# Patient Record
Sex: Male | Born: 1975 | Race: White | Hispanic: No | Marital: Married | Smoking: Current every day smoker
Health system: Southern US, Community
[De-identification: ages and names within clinical notes are randomized; demographics above are authoritative.]

## PROBLEM LIST (undated history)

## (undated) DIAGNOSIS — I639 Cerebral infarction, unspecified: Secondary | ICD-10-CM

## (undated) DIAGNOSIS — G4730 Sleep apnea, unspecified: Secondary | ICD-10-CM

## (undated) DIAGNOSIS — R569 Unspecified convulsions: Secondary | ICD-10-CM

---

## 2009-10-13 ENCOUNTER — Ambulatory Visit: Payer: Self-pay | Admitting: Internal Medicine

## 2010-07-19 ENCOUNTER — Ambulatory Visit: Payer: Self-pay | Admitting: Family Medicine

## 2013-12-29 ENCOUNTER — Ambulatory Visit: Payer: Self-pay | Admitting: Physician Assistant

## 2019-04-24 ENCOUNTER — Ambulatory Visit: Payer: Self-pay | Attending: Internal Medicine

## 2019-04-24 DIAGNOSIS — Z23 Encounter for immunization: Secondary | ICD-10-CM

## 2019-04-24 NOTE — Progress Notes (Signed)
   Covid-19 Vaccination Clinic  Name:  Joseph Dunlap    MRN: [id number] DOB: [birth year]  04/24/2019  Mr. [name] was observed post Covid-19 immunization for 15 minutes without incident. He was provided with Vaccine Information Sheet and instruction to access the V-Safe system.   Mr. [name] was instructed to call 911 with any severe reactions post vaccine: Marland Kitchen Difficulty breathing  . Swelling of face and throat  . A fast heartbeat  . A bad rash all over body  . Dizziness and weakness   Immunizations Administered    Name Date Dose VIS Date Route   Moderna COVID-19 Vaccine 04/24/2019 10:22 AM 0.5 mL 01/13/2019 Intramuscular   Manufacturer: Moderna   Lot: [id number]   NDC: 77939-030-09

## 2019-05-26 ENCOUNTER — Ambulatory Visit: Payer: Self-pay | Attending: Internal Medicine

## 2019-05-26 DIAGNOSIS — Z23 Encounter for immunization: Secondary | ICD-10-CM

## 2019-05-26 NOTE — Progress Notes (Signed)
   Covid-19 Vaccination Clinic  Name:  Zannie Runkle    MRN: [id number] DOB: 09-May-1975  05/26/2019  Mr. Monteverde was observed post Covid-19 immunization for 15 minutes without incident. He was provided with Vaccine Information Sheet and instruction to access the V-Safe system.   Mr. Scallon was instructed to call 911 with any severe reactions post vaccine: Marland Kitchen Difficulty breathing  . Swelling of face and throat  . A fast heartbeat  . A bad rash all over body  . Dizziness and weakness   Immunizations Administered    Name Date Dose VIS Date Route   Moderna COVID-19 Vaccine 05/26/2019  8:48 AM 0.5 mL 01/13/2019 Intramuscular   Manufacturer: Moderna   Lot: [id number]   NDC: 50277-412-87

## 2020-04-11 ENCOUNTER — Other Ambulatory Visit: Payer: Self-pay

## 2020-04-11 ENCOUNTER — Emergency Department: Payer: 59

## 2020-04-11 ENCOUNTER — Inpatient Hospital Stay
Admission: EM | Admit: 2020-04-11 | Discharge: 2020-04-13 | DRG: 065 | Disposition: A | Discharge status: Other Acute Inpatient Hospital | Payer: 59 | Attending: Student | Admitting: Student

## 2020-04-11 DIAGNOSIS — E785 Hyperlipidemia, unspecified: Secondary | ICD-10-CM | POA: Diagnosis present

## 2020-04-11 DIAGNOSIS — G4710 Hypersomnia, unspecified: Secondary | ICD-10-CM | POA: Diagnosis present

## 2020-04-11 DIAGNOSIS — I6381 Other cerebral infarction due to occlusion or stenosis of small artery: Secondary | ICD-10-CM | POA: Diagnosis not present

## 2020-04-11 DIAGNOSIS — G9349 Other encephalopathy: Secondary | ICD-10-CM | POA: Diagnosis present

## 2020-04-11 DIAGNOSIS — G4733 Obstructive sleep apnea (adult) (pediatric): Secondary | ICD-10-CM | POA: Diagnosis present

## 2020-04-11 DIAGNOSIS — R400 Somnolence: Secondary | ICD-10-CM | POA: Diagnosis not present

## 2020-04-11 DIAGNOSIS — G9340 Encephalopathy, unspecified: Secondary | ICD-10-CM

## 2020-04-11 DIAGNOSIS — Z20822 Contact with and (suspected) exposure to covid-19: Secondary | ICD-10-CM | POA: Diagnosis present

## 2020-04-11 DIAGNOSIS — F17210 Nicotine dependence, cigarettes, uncomplicated: Secondary | ICD-10-CM | POA: Diagnosis present

## 2020-04-11 DIAGNOSIS — I639 Cerebral infarction, unspecified: Secondary | ICD-10-CM

## 2020-04-11 DIAGNOSIS — R001 Bradycardia, unspecified: Secondary | ICD-10-CM | POA: Diagnosis present

## 2020-04-11 DIAGNOSIS — Z6835 Body mass index (BMI) 35.0-35.9, adult: Secondary | ICD-10-CM

## 2020-04-11 DIAGNOSIS — E669 Obesity, unspecified: Secondary | ICD-10-CM | POA: Diagnosis present

## 2020-04-11 HISTORY — DX: Sleep apnea, unspecified: G47.30

## 2020-04-11 LAB — CBC WITH DIFFERENTIAL/PLATELET
Abs Immature Granulocytes: 0.03 10*3/uL (ref 0.00–0.07)
Basophils Absolute: 0.1 10*3/uL (ref 0.0–0.1)
Basophils Relative: 1 %
Eosinophils Absolute: 0.5 10*3/uL (ref 0.0–0.5)
Eosinophils Relative: 6 %
HCT: 45.1 % (ref 39.0–52.0)
Hemoglobin: 14.8 g/dL (ref 13.0–17.0)
Immature Granulocytes: 0 %
Lymphocytes Relative: 24 %
Lymphs Abs: 2.3 10*3/uL (ref 0.7–4.0)
MCH: 29.4 pg (ref 26.0–34.0)
MCHC: 32.8 g/dL (ref 30.0–36.0)
MCV: 89.5 fL (ref 80.0–100.0)
Monocytes Absolute: 0.8 10*3/uL (ref 0.1–1.0)
Monocytes Relative: 8 %
Neutro Abs: 5.7 10*3/uL (ref 1.7–7.7)
Neutrophils Relative %: 61 %
Platelets: 250 10*3/uL (ref 150–400)
RBC: 5.04 MIL/uL (ref 4.22–5.81)
RDW: 12.8 % (ref 11.5–15.5)
WBC: 9.4 10*3/uL (ref 4.0–10.5)
nRBC: 0 % (ref 0.0–0.2)

## 2020-04-11 LAB — COMPREHENSIVE METABOLIC PANEL
ALT: 34 U/L (ref 0–44)
AST: 22 U/L (ref 15–41)
Albumin: 4 g/dL (ref 3.5–5.0)
Alkaline Phosphatase: 51 U/L (ref 38–126)
Anion gap: 7 (ref 5–15)
BUN: 15 mg/dL (ref 6–20)
CO2: 27 mmol/L (ref 22–32)
Calcium: 8.9 mg/dL (ref 8.9–10.3)
Chloride: 103 mmol/L (ref 98–111)
Creatinine, Ser: 1.01 mg/dL (ref 0.61–1.24)
GFR, Estimated: >60 mL/min (ref >60)
Glucose, Bld: 83 mg/dL (ref 70–99)
Potassium: 4.1 mmol/L (ref 3.5–5.1)
Sodium: 137 mmol/L (ref 135–145)
Total Bilirubin: 1 mg/dL (ref 0.3–1.2)
Total Protein: 7.7 g/dL (ref 6.5–8.1)

## 2020-04-11 LAB — ETHANOL: Alcohol, Ethyl (B): <10 mg/dL (ref <10)

## 2020-04-11 MED ORDER — NALOXONE HCL 2 MG/2ML IJ SOSY
0.4000 mg | PREFILLED_SYRINGE | Freq: Once | INTRAMUSCULAR | Status: AC
  Administered 2020-04-11: 0.4 mg via INTRAVENOUS
  Filled 2020-04-11: qty 2

## 2020-04-11 MED ORDER — SODIUM CHLORIDE 0.9 % IV BOLUS
1000.0000 mL | Freq: Once | INTRAVENOUS | Status: AC
  Administered 2020-04-11: 1000 mL via INTRAVENOUS

## 2020-04-11 NOTE — ED Notes (Signed)
Patient remains lethargic, opens eyes to verbal stimuli, urinal placed at bedside for urine specimen.

## 2020-04-11 NOTE — ED Provider Notes (Signed)
[facility] Emergency Department Provider Note  ____________________________________________   Event Date/Time   First MD Initiated Contact with Patient 04/11/20 2100     (approximate)  I have reviewed the triage vital signs and the nursing notes.   HISTORY  Chief Complaint Altered Mental Status    HPI Joseph Dunlap is a 45 y.o. male who comes in with difficulty to arouse. On EMS arrival patient was noted to be arousable but more lethargic than normal.  They would say his name and he would make some movements but would not really talk much.  Patient's blood pressure was 104/50, normal glucose.  Patient got 500 mL normal saline.  He was given atropine x2 at 0.5 due to low heart rates.  Patient is able to answer my questions but he is whispering. I asked if he used any drugs and he replied "heroin". I asked how when he used it and he said " I don't remember"  I asked if his wife new about using it and he said "yes".  When I asked if he snorted or injected he said "into nerves"  Unable to tell me how much.  Unknown LLK.   History limited due to patient's altered mental status     History reviewed. No pertinent past medical history.  There are no problems to display for this patient.   History reviewed. No pertinent surgical history.  Prior to Admission medications   Not on File    Allergies Patient has no allergy information on record.  No family history on file.  Social History   Endorses heroin use.  Denies alcohol.   Review of Systems Review of systems is limited due to patient's altered mental status. ____________________________________________   PHYSICAL EXAM:  VITAL SIGNS: ED Triage Vitals  Enc Vitals Group     BP 04/11/20 2111 (!) 141/86     Pulse Rate 04/11/20 2111 63     Resp 04/11/20 2111 15     Temp 04/11/20 2111 97.9 F (36.6 C)     Temp Source 04/11/20 2111 Oral     SpO2 04/11/20 2111 98 %     Weight 04/11/20  2113 280 lb (127 kg)     Height 04/11/20 2113 6\' 3"  (1.905 m)     Head Circumference --      Peak Flow --      Pain Score --      Pain Loc --      Pain Edu? --      Excl. in GC? --     Constitutional: middle age male, sleepy but patient appears tired and is whispering to me. Eyes: Conjunctivae are normal. EOMI. pupils are equal size. Head: Atraumatic. Nose: No congestion/rhinnorhea. Mouth/Throat: Mucous membranes are moist.   Neck: No stridor. Trachea Midline. FROM Cardiovascular: Normal rate, regular rhythm. Grossly normal heart sounds.  Good peripheral circulation. Respiratory: Normal respiratory effort.  No retractions. Lungs CTAB. Gastrointestinal: Soft and nontender. No distention. No abdominal bruits.  Musculoskeletal: No lower extremity tenderness nor edema.  No joint effusions. Neurologic:  Patient able to stand up and sit down on the stretcher. No obvious weakness in arms or legs but limited examination due to pt not consistently following commands. No facial droop. Pt whispering but not aphasia.  Skin:  Skin is warm, dry and intact. No rash noted. Psychiatric: unable to fully access  GU: Deferred   ____________________________________________   LABS (all labs ordered are listed, but only abnormal results are  displayed)  Labs Reviewed  CBC WITH DIFFERENTIAL/PLATELET  COMPREHENSIVE METABOLIC PANEL  ETHANOL  URINE DRUG SCREEN, QUALITATIVE ([facility] ONLY)  URINALYSIS, COMPLETE ([facility]) WITH MICROSCOPIC  ACETAMINOPHEN LEVEL  SALICYLATE LEVEL   ____________________________________________   ED ECG REPORT I, Concha Se, the attending physician, personally viewed and interpreted this ECG.  Normal sinus rate of 63, no ST elevation, no T wave inversions, normal intervals ____________________________________________  RADIOLOGY Vela Prose, personally viewed and evaluated these images (plain radiographs) as part of my medical decision making, as well as reviewing the  written report by the radiologist.  ED MD interpretation:  pending   ____________________________________________   PROCEDURES  Procedure(s) performed (including Critical Care):  .1-3 Lead EKG Interpretation Performed by: Concha Se, MD Authorized by: Concha Se, MD     Interpretation: abnormal     ECG rate:  50-60s   ECG rate assessment: bradycardic     Rhythm: sinus rhythm     Ectopy: none     Conduction: normal       ____________________________________________   INITIAL IMPRESSION / ASSESSMENT AND PLAN / ED COURSE  [name] was evaluated in Emergency Department on 04/11/2020 for the symptoms described in the history of present illness. He was evaluated in the context of the global COVID-19 pandemic, which necessitated consideration that the patient might be at risk for infection with the SARS-CoV-2 virus that causes COVID-19. Institutional protocols and algorithms that pertain to the evaluation of patients at risk for COVID-19 are in a state of rapid change based on information released by regulatory bodies including the CDC and federal and state organizations. These policies and algorithms were followed during the patient's care in the ED.    Patient is a 45 year old who was difficult to arouse by family and brought to the ER.  Concern for bradycardia but suspect that his heart rate was just low secondary to him sleeping and this questionable heroin use although no pin point pupils.  Unknown LLK but no focal abnormality of neuro exam. Able to stand up and get into stretcher.  We will keep patient on the cardiac monitor.  Will get labs evaluate for Electra abnormalities, AKI.  No signs of trauma to suggest intracranial hemorrhage.  At this time patient's not hypoxic and has normal respiratory rate so do not think we need to give Narcan  Pt still somnolent trialed narcan with no response.  Called wife and she denies him ever using heroin?  Pt did say he used  heroin when I asked about drug use.  No signs of trauma per wife. Reports history of sleep apnea and difficulty waking sometimes but never like this. Will add on CT head  Pt handed off to incoming team pending CT scan/further workup and evluate for AMS.          ____________________________________________   FINAL CLINICAL IMPRESSION(S) / ED DIAGNOSES   Final diagnoses:  Somnolence      MEDICATIONS GIVEN DURING THIS VISIT:  Medications  sodium chloride 0.9 % bolus 1,000 mL (has no administration in time range)  naloxone [facility]) injection 0.4 mg (0.4 mg Intravenous Given 04/11/20 2253)  sodium chloride 0.9 % bolus 1,000 mL (0 mLs Intravenous Stopped 04/12/20 0033)     ED Discharge Orders    None       Note:  This document was prepared using Dragon voice recognition software and may include unintentional dictation errors.   Concha Se, MD 04/12/20 [phone]

## 2020-04-11 NOTE — ED Triage Notes (Signed)
Pt arrives to ED from home via [facility] with c/c of difficult to rouse and altered mental status. Pts wife stated to EMS that he is often difficult to rouse but this was way more difficult than usual. EMS reports initial vitals of p 40, 104/50, CBG 82. Pt given bolus 500 mL NS and 0.5 mg atropine x2 with subsequent pulse of 75. EMS placed 18G in right AC. Upon arrival, pt somnolent but roused to voice. With repeated questioning pt whispered that he had used heroin. Pt unable or unwilling to respond further.

## 2020-04-11 NOTE — ED Notes (Addendum)
Resumed care from robin rn.  Pt sleeping.  Sinus brady on monitor.  Iv fluids infusing.  No response to narcan.

## 2020-04-11 NOTE — ED Notes (Signed)
[phone] wifes number for ride home

## 2020-04-12 ENCOUNTER — Inpatient Hospital Stay: Payer: 59

## 2020-04-12 ENCOUNTER — Other Ambulatory Visit: Payer: Self-pay

## 2020-04-12 ENCOUNTER — Emergency Department: Payer: 59

## 2020-04-12 ENCOUNTER — Inpatient Hospital Stay: Admit: 2020-04-12 | Payer: 59

## 2020-04-12 ENCOUNTER — Encounter: Payer: Self-pay | Admitting: Family Medicine

## 2020-04-12 DIAGNOSIS — Z9989 Dependence on other enabling machines and devices: Secondary | ICD-10-CM | POA: Diagnosis not present

## 2020-04-12 DIAGNOSIS — Z6835 Body mass index (BMI) 35.0-35.9, adult: Secondary | ICD-10-CM | POA: Diagnosis not present

## 2020-04-12 DIAGNOSIS — G9349 Other encephalopathy: Secondary | ICD-10-CM | POA: Diagnosis present

## 2020-04-12 DIAGNOSIS — R400 Somnolence: Secondary | ICD-10-CM | POA: Diagnosis present

## 2020-04-12 DIAGNOSIS — G4710 Hypersomnia, unspecified: Secondary | ICD-10-CM | POA: Diagnosis present

## 2020-04-12 DIAGNOSIS — I63139 Cerebral infarction due to embolism of unspecified carotid artery: Secondary | ICD-10-CM

## 2020-04-12 DIAGNOSIS — E785 Hyperlipidemia, unspecified: Secondary | ICD-10-CM

## 2020-04-12 DIAGNOSIS — I6381 Other cerebral infarction due to occlusion or stenosis of small artery: Secondary | ICD-10-CM | POA: Diagnosis present

## 2020-04-12 DIAGNOSIS — G9340 Encephalopathy, unspecified: Secondary | ICD-10-CM | POA: Diagnosis not present

## 2020-04-12 DIAGNOSIS — I639 Cerebral infarction, unspecified: Secondary | ICD-10-CM | POA: Diagnosis present

## 2020-04-12 DIAGNOSIS — R001 Bradycardia, unspecified: Secondary | ICD-10-CM | POA: Diagnosis present

## 2020-04-12 DIAGNOSIS — Z20822 Contact with and (suspected) exposure to covid-19: Secondary | ICD-10-CM | POA: Diagnosis present

## 2020-04-12 DIAGNOSIS — F17210 Nicotine dependence, cigarettes, uncomplicated: Secondary | ICD-10-CM | POA: Diagnosis present

## 2020-04-12 DIAGNOSIS — G4733 Obstructive sleep apnea (adult) (pediatric): Secondary | ICD-10-CM | POA: Diagnosis present

## 2020-04-12 DIAGNOSIS — E669 Obesity, unspecified: Secondary | ICD-10-CM | POA: Diagnosis present

## 2020-04-12 LAB — URINALYSIS, COMPLETE (UACMP) WITH MICROSCOPIC
Bacteria, UA: NONE SEEN
Bilirubin Urine: NEGATIVE
Glucose, UA: NEGATIVE mg/dL
Hgb urine dipstick: NEGATIVE
Ketones, ur: NEGATIVE mg/dL
Leukocytes,Ua: NEGATIVE
Nitrite: NEGATIVE
Protein, ur: NEGATIVE mg/dL
Specific Gravity, Urine: 1.021 (ref 1.005–1.030)
pH: 5 (ref 5.0–8.0)

## 2020-04-12 LAB — URINE DRUG SCREEN, QUALITATIVE (ARMC ONLY)
Amphetamines, Ur Screen: NOT DETECTED
Barbiturates, Ur Screen: NOT DETECTED
Benzodiazepine, Ur Scrn: NOT DETECTED
Cannabinoid 50 Ng, Ur ~~LOC~~: NOT DETECTED
Cocaine Metabolite,Ur ~~LOC~~: NOT DETECTED
MDMA (Ecstasy)Ur Screen: NOT DETECTED
Methadone Scn, Ur: NOT DETECTED
Opiate, Ur Screen: NOT DETECTED
Phencyclidine (PCP) Ur S: NOT DETECTED
Tricyclic, Ur Screen: NOT DETECTED

## 2020-04-12 LAB — SEDIMENTATION RATE: Sed Rate: 19 mm/hr — ABNORMAL HIGH (ref 0–15)

## 2020-04-12 LAB — MAGNESIUM: Magnesium: 1.9 mg/dL (ref 1.7–2.4)

## 2020-04-12 LAB — BASIC METABOLIC PANEL
Anion gap: 6 (ref 5–15)
BUN: 15 mg/dL (ref 6–20)
CO2: 27 mmol/L (ref 22–32)
Calcium: 9 mg/dL (ref 8.9–10.3)
Chloride: 102 mmol/L (ref 98–111)
Creatinine, Ser: 1.13 mg/dL (ref 0.61–1.24)
GFR, Estimated: >60 mL/min (ref >60)
Glucose, Bld: 116 mg/dL — ABNORMAL HIGH (ref 70–99)
Potassium: 4.1 mmol/L (ref 3.5–5.1)
Sodium: 135 mmol/L (ref 135–145)

## 2020-04-12 LAB — GLUCOSE, CAPILLARY
Glucose-Capillary: 100 mg/dL — ABNORMAL HIGH (ref 70–99)
Glucose-Capillary: 125 mg/dL — ABNORMAL HIGH (ref 70–99)
Glucose-Capillary: 61 mg/dL — ABNORMAL LOW (ref 70–99)

## 2020-04-12 LAB — RESP PANEL BY RT-PCR (FLU A&B, COVID) ARPGX2
Influenza A by PCR: NEGATIVE
Influenza B by PCR: NEGATIVE
SARS Coronavirus 2 by RT PCR: NEGATIVE

## 2020-04-12 LAB — ANTITHROMBIN III: AntiThromb III Func: 92 % (ref 75–120)

## 2020-04-12 LAB — SALICYLATE LEVEL: Salicylate Lvl: <7 mg/dL — ABNORMAL LOW (ref 7.0–30.0)

## 2020-04-12 LAB — VITAMIN B12: Vitamin B-12: 289 pg/mL (ref 180–914)

## 2020-04-12 LAB — FOLATE: Folate: 11.4 ng/mL (ref >5.9)

## 2020-04-12 LAB — ACETAMINOPHEN LEVEL: Acetaminophen (Tylenol), Serum: <10 ug/mL — ABNORMAL LOW (ref 10–30)

## 2020-04-12 LAB — HIV ANTIBODY (ROUTINE TESTING W REFLEX): HIV Screen 4th Generation wRfx: NONREACTIVE

## 2020-04-12 LAB — AMMONIA: Ammonia: 14 umol/L (ref 9–35)

## 2020-04-12 LAB — TSH: TSH: 1.655 u[IU]/mL (ref 0.350–4.500)

## 2020-04-12 MED ORDER — ENOXAPARIN SODIUM 80 MG/0.8ML ~~LOC~~ SOLN
65.0000 mg | SUBCUTANEOUS | Status: DC
  Administered 2020-04-12 – 2020-04-13 (×2): 65 mg via SUBCUTANEOUS
  Filled 2020-04-12 (×2): qty 0.8

## 2020-04-12 MED ORDER — ASPIRIN 325 MG PO TABS
325.0000 mg | ORAL_TABLET | Freq: Every day | ORAL | Status: DC
  Filled 2020-04-12: qty 1

## 2020-04-12 MED ORDER — FAMOTIDINE 20 MG PO TABS
20.0000 mg | ORAL_TABLET | Freq: Every day | ORAL | Status: DC
  Administered 2020-04-12 – 2020-04-13 (×2): 20 mg via ORAL
  Filled 2020-04-12 (×2): qty 1

## 2020-04-12 MED ORDER — SODIUM CHLORIDE 0.9 % IV BOLUS (SEPSIS)
1000.0000 mL | Freq: Once | INTRAVENOUS | Status: AC
  Administered 2020-04-12: 1000 mL via INTRAVENOUS

## 2020-04-12 MED ORDER — MAGNESIUM HYDROXIDE 400 MG/5ML PO SUSP
30.0000 mL | Freq: Every day | ORAL | Status: DC | PRN
  Filled 2020-04-12: qty 30

## 2020-04-12 MED ORDER — DEXTROSE IN LACTATED RINGERS 5 % IV SOLN: INTRAVENOUS | Status: DC

## 2020-04-12 MED ORDER — ACETAMINOPHEN 325 MG PO TABS: 650.0000 mg | ORAL_TABLET | Freq: Four times a day (QID) | ORAL | Status: DC | PRN

## 2020-04-12 MED ORDER — TRAZODONE HCL 50 MG PO TABS: 25.0000 mg | ORAL_TABLET | Freq: Every evening | ORAL | Status: DC | PRN

## 2020-04-12 MED ORDER — CLOPIDOGREL BISULFATE 75 MG PO TABS
75.0000 mg | ORAL_TABLET | Freq: Every day | ORAL | Status: DC
  Administered 2020-04-13: 10:00:00 75 mg via ORAL
  Filled 2020-04-12: qty 1

## 2020-04-12 MED ORDER — ACETAMINOPHEN 650 MG RE SUPP: 650.0000 mg | Freq: Four times a day (QID) | RECTAL | Status: DC | PRN

## 2020-04-12 MED ORDER — THIAMINE HCL 100 MG/ML IJ SOLN: 250.0000 mg | Freq: Every day | INTRAVENOUS | Status: DC

## 2020-04-12 MED ORDER — ONDANSETRON HCL 4 MG PO TABS: 4.0000 mg | ORAL_TABLET | Freq: Four times a day (QID) | ORAL | Status: DC | PRN

## 2020-04-12 MED ORDER — THIAMINE HCL 100 MG/ML IJ SOLN: 100.0000 mg | Freq: Every day | INTRAMUSCULAR | Status: DC

## 2020-04-12 MED ORDER — THIAMINE HCL 100 MG/ML IJ SOLN
500.0000 mg | Freq: Three times a day (TID) | INTRAVENOUS | Status: DC
  Administered 2020-04-12 – 2020-04-13 (×3): 500 mg via INTRAVENOUS
  Filled 2020-04-12 (×6): qty 5

## 2020-04-12 MED ORDER — ASPIRIN 300 MG RE SUPP: 300.0000 mg | Freq: Every day | RECTAL | Status: DC

## 2020-04-12 MED ORDER — CLOPIDOGREL BISULFATE 75 MG PO TABS
300.0000 mg | ORAL_TABLET | Freq: Once | ORAL | Status: AC
  Administered 2020-04-12: 22:00:00 300 mg via ORAL
  Filled 2020-04-12: qty 4

## 2020-04-12 MED ORDER — ONDANSETRON HCL 4 MG/2ML IJ SOLN: 4.0000 mg | Freq: Four times a day (QID) | INTRAMUSCULAR | Status: DC | PRN

## 2020-04-12 MED ORDER — IOHEXOL 350 MG/ML SOLN
75.0000 mL | Freq: Once | INTRAVENOUS | Status: AC | PRN
  Administered 2020-04-12: 75 mL via INTRAVENOUS

## 2020-04-12 MED ORDER — ATORVASTATIN CALCIUM 20 MG PO TABS
40.0000 mg | ORAL_TABLET | Freq: Every day | ORAL | Status: DC
  Filled 2020-04-12: qty 2

## 2020-04-12 MED ORDER — STROKE: EARLY STAGES OF RECOVERY BOOK: Freq: Once | Status: AC

## 2020-04-12 MED ORDER — ASPIRIN EC 81 MG PO TBEC
81.0000 mg | DELAYED_RELEASE_TABLET | Freq: Every day | ORAL | Status: DC
  Administered 2020-04-12 – 2020-04-13 (×2): 81 mg via ORAL
  Filled 2020-04-12 (×2): qty 1

## 2020-04-12 MED ORDER — VITAMIN B-12 1000 MCG PO TABS
1000.0000 ug | ORAL_TABLET | Freq: Every day | ORAL | Status: DC
  Administered 2020-04-13: 1000 ug via ORAL
  Filled 2020-04-12 (×3): qty 1

## 2020-04-12 MED ORDER — SODIUM CHLORIDE 0.9 % IV SOLN: INTRAVENOUS | Status: DC

## 2020-04-12 NOTE — Consult Note (Signed)
NEUROLOGY CONSULTATION NOTE   Date of service: April 12, 2020 Patient Name: Joseph Dunlap MRN:  [id number] DOB:  [birth year] Reason for consult: "Stroke on MRI" _ _ _   _ __   _ __ _ _  __ __   _ __   __ _  History of Present Illness  Joseph Dunlap is a 45 y.o. male with PMH significant for  OSA who presents with confusion and difficulty waking up.   Patient unable to provide any meaningful history. Discussed cat and dog and seeing a devil when asked questions related to his presentation to the ED.  Joseph Dunlap reports that it is not unusual for patient to have difficulty with arousing from sleep. However, this episode was quite unusual even for him. They attempted to wake him up several times, having the kids try. Initially thought that he was up really late last night. They eventually called EMS. He would not answer any questions for the EMS. He did get up for them, walked over to the front of the house and got into the ambulance for them.  In the ED, he was noted to be persistently somnolent. He would wake up for the ED team upon lot of encouragement but go back to sleep. Joseph Dunlap reports that he appeared more lethargic earlier in the day but she did not think too much about it back then.  No prior hx of strokes, they have Carbon Monoxide alarms throughout the house and no concern for CO poisoning. No concern for exposures to any toxins on discussion with Joseph Dunlap. 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 Did not report a headache, no neck pain, no neck rigidity, no fever or chills at home.  He was given Narcan in the ED with no improvement. He was noted to be bradycardic in the ED but that was thought to be due to him sleeping. CTH without contrast with no acute abnormalities. MRI was limited due to motion and only DWI images could be obtained but was notable for left greater than right, bilateral basal ganglia DWI abrnomalities. These are most consistent with stroke, rarely toxic metabolic abnormalities can present this way  too.   ROS   Unable to obtain a detailed ROS given unable to provide any meaningful history. But no pain. No headache.  Past History   Past Medical History:  Diagnosis Date  . Sleep apnea    History reviewed. No pertinent surgical history. History reviewed. No pertinent family history. Social History   Socioeconomic History  . Marital status: Married    Spouse name: Not on file  . Number of children: Not on file  . Years of education: Not on file  . Highest education level: Not on file  Occupational History  . Not on file  Tobacco Use  . Smoking status: Current Every Day Smoker  . Smokeless tobacco: Never Used  Substance and Sexual Activity  . Alcohol use: Never  . Drug use: Never  . Sexual activity: Not on file  Other Topics Concern  . Not on file  Social History Narrative  . Not on file   Social Determinants of Health   Financial Resource Strain: Not on file  Food Insecurity: Not on file  Transportation Needs: Not on file  Physical Activity: Not on file  Stress: Not on file  Social Connections: Not on file   Allergies  Allergen Reactions  . Benadryl [Diphenhydramine] Other (See Comments)    Makes patient lethargic  . Other Other (See Comments)  Per Joseph Dunlap, Antihistamines such as benadryl/zyrtec or meds with the possibility of causing drowsiness will make patient lethargic.     Medications  (Not in a hospital admission)    Vitals   Vitals:   04/12/20 0730 04/12/20 0848 04/12/20 1130 04/12/20 1245  BP: 123/74 140/75 111/87 140/72  Pulse: (!) 50 (!) 58 (!) 52 (!) 53  Resp: 12 15 (!) 23 (!) 21  Temp:      TempSrc:      SpO2: 96% 95% 98% 98%  Weight:      Height:         Body mass index is 35 kg/m.  Physical Exam   General: Laying comfortably in bed; in no acute distress. HENT: Normal oropharynx and mucosa. Normal external appearance of ears and nose. Neck: Supple, no pain or tenderness CV: No JVD. No peripheral edema. Pulmonary: Symmetric  Chest rise. Normal respiratory effort. Abdomen: Soft to touch, non-tender. Ext: No cyanosis, edema, or deformity Skin: No rash. Normal palpation of skin.  Musculoskeletal: Normal digits and nails by inspection. No clubbing.  Neurologic Examination  Mental status/Cognition: Alert, oriented to self and place, but not to month, year and situation. Poor attention. Speech/language: Fluent, comprehension intact to simple commands, object naming intact.  Cranial nerves:   CN II Pupils equal and reactive to light, no VF deficits   CN III,IV,VI EOM intact, no gaze preference or deviation, no nystagmus   CN V normal sensation in V1, V2, and V3 segments bilaterally   CN VII no asymmetry, no nasolabial fold flattening   CN VIII normal hearing to speech   CN IX & X normal palatal elevation, no uvular deviation   CN XI 5/5 head turn and 5/5 shoulder shrug bilaterally   CN XII midline tongue protrusion   Motor:  Muscle bulk: normal, tone normal, pronator drift none tremor none Mvmt Root Nerve  Muscle Right Left Comments  SA C5/6 Ax Deltoid 5 5   EF C5/6 Mc Biceps 5 5   EE C6/7/8 Rad Triceps 5 5   WF C6/7 Med FCR     WE C7/8 PIN ECU     F Ab C8/T1 U ADM/FDI 5 5   HF L1/2/3 Fem Illopsoas 5 5   KE L2/3/4 Fem Quad 5 5   DF L4/5 D Peron Tib Ant 5 5   PF S1/2 Tibial Grc/Sol 5 5    Reflexes:  Right Left Comments  Pectoralis      Biceps (C5/6) 2 2   Brachioradialis (C5/6) 2 2    Triceps (C6/7) 2 2    Patellar (L3/4) 2+ 2+    Achilles (S1) 1 1    Hoffman      Plantar withdraws withdraws   Jaw jerk    Sensation:  Light touch Intact throughout   Pin prick    Temperature    Vibration   Proprioception    Coordination/Complex Motor:  - Finger to Nose intact BL - Heel to shin intact BL - Rapid alternating movement are normal - Gait: Deferred.  Labs   CBC:  Recent Labs  Lab 04/11/20 2145  WBC 9.4  NEUTROABS 5.7  HGB 14.8  HCT 45.1  MCV 89.5  PLT 557    Basic Metabolic Panel:   Lab Results  Component Value Date   NA 137 04/11/2020   K 4.1 04/11/2020   CO2 27 04/11/2020   GLUCOSE 83 04/11/2020   BUN 15 04/11/2020   CREATININE 1.01 04/11/2020   CALCIUM  8.9 04/11/2020   GFRNONAA >60 04/11/2020   Lipid Panel: No results found for: LDLCALC HgbA1c: No results found for: HGBA1C Urine Drug Screen:     Component Value Date/Time   LABOPIA NONE DETECTED 04/12/2020 0230   COCAINSCRNUR NONE DETECTED 04/12/2020 0230   LABBENZ NONE DETECTED 04/12/2020 0230   AMPHETMU NONE DETECTED 04/12/2020 0230   THCU NONE DETECTED 04/12/2020 0230   LABBARB NONE DETECTED 04/12/2020 0230    Alcohol Level     Component Value Date/Time   ETH <10 04/11/2020 2145    CT Head without contrast: CTH was negative for a large hypodensity concerning for a large territory infarct or hyperdensity concerning for an ICH  CT angio Head and Neck with contrast: No LVO or significant stenosis.  MRI Brain was limited to just diffusion images. Notable for left greater than right basal ganglia strokes.  rEEG: pending  Impression   Joseph Dunlap is a 45 y.o. male with PMH significant for OSA who presents with acute onset hypersomnolence and predominant cognitive and attention deficit. 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 He was found to have left greater than right basal ganglia strokes. The stroke seem to be most likely in the anterior choroidal artery distribution and I suspect that these are embolic. It would be unusual for these strokes to present this way thou. Althou, I have seen artery of percheron strokes present this way but those typically involve anterior and paramedian thalamus rather than basal ganglia. No significant electrolyte or lab abnormalities noted.Another consideration is exposure to any toxins but his Urine drug screen is negative, EtOh levels undetectable. No clear exposure to Carbon Monoxide, no Manganese exposure, no  clear EtOH intake but will check for Volatiles in blood. No clinical concern for meningitis, no neck rigidity, no leukocytosis. Will assess for nutritional causes of AMS but if this was related to his nutrition, would expect it to have a more subacute course. Joseph Dunlap does endorse poor dietary habits with diet mostly consisting of meat only.  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 - LDL is pending. - Please start statin if LDL > 70 - HbA1c is pending. - Antithrombotic - Aspirin 15m daily. - Recommend DVT ppx - SBP goal - permissive hypertension first 24 h < 220/110. Hold home meds.  - Recommend Telemetry monitoring for arrythmia - Recommend bedside swallow screen prior to PO intake. - Stroke education booklet - Recommend PT/OT/SLP consult - I ordered Hypercaog workup with ANA with reflex to IFA, ANCA titers, APLA Ab panel, AT3, FVL, MTHFR, Protein C and S activity, ESR. - In addition, I ordered serum drug screen, volatile blood panel, Co-oximetry panel, Vit B1 levels with thiamine replacement, PO Cyanocobalamin for low normal B12 levels, Methylmalonic acid. - I ordered a routine EEG.  ____________________________________________________________________  Thank you for the opportunity to take part in the care of this patient. If you have any further questions, please contact the neurology consultation attending.  Signed,  SHopePager Number [phone]_ _ _   _ __   _ __ _ _  __ __   _ __   __ _

## 2020-04-12 NOTE — ED Notes (Signed)
With pts permission, this RN did an in and out catheter, 37fr, sterile technique. of yellow urine output collected.

## 2020-04-12 NOTE — Progress Notes (Signed)
Anticoagulation monitoring(Lovenox):  45 yo male ordered Lovenox 40 mg Q24h  Filed Weights   04/11/20 2113  Weight: 127 kg (280 lb)   BMI 35   Lab Results  Component Value Date   CREATININE 1.01 04/11/2020   Estimated Creatinine Clearance: 131.2 mL/min (by C-G formula based on SCr of 1.01 mg/dL). Hemoglobin & Hematocrit     Component Value Date/Time   HGB 14.8 04/11/2020 2145   HCT 45.1 04/11/2020 2145     Per Protocol for Patient with estCrcl > 30 ml/min and BMI > 40, will transition to Lovenox 65 mg Q24h.

## 2020-04-12 NOTE — Hospital Course (Addendum)
Summary of HPI on admission: 45 year old male with past medical history of hyperlipidemia, OSA on CPAP, insomnia who presented to the ED on early morning 04/12/2020 with altered mental status and decreased responsiveness.  Per EMS was only arousable to sternal rub.  Patient denied paresthesias, ataxia, focal muscle weakness, vertigo or tinnitus.  Somnolent during admission encounter as well but was able to answer questions although slowly.  Neuroimaging in the ED showed acute CVA of the left basal ganglia and chronic right basal ganglia lacunar infarct. CTAs of head and neck were negative for flow-limiting stenosis.  Hospital course to date: Admitted to hospitalist service with neurology consulted. Started on aspirin and statin therapy. Further evaluation is underway.

## 2020-04-12 NOTE — H&P (Signed)
[place]   PATIENT NAME: Joseph Dunlap    MR#:  [id number]  DATE OF BIRTH:  05/20/1975  DATE OF ADMISSION:  04/11/2020  PRIMARY CARE PHYSICIAN: Patient, No Pcp Per   Patient is coming from: Home  REQUESTING/REFERRING PHYSICIAN: Ward, Layla Maw, DO CHIEF COMPLAINT:   Chief Complaint  Patient presents with  . Drug Overdose    HISTORY OF PRESENT ILLNESS:  Joseph Dunlap is a 45 y.o. Caucasian male with medical history significant for dyslipidemia, obstructive sleep apnea and insomnia, who presented to the emergency room with acute onset of altered mental status with decreased responsiveness.  The patient was arousable to sternal rub by EMS.  No witnessed seizures he was globally confused.  No paresthesias or focal muscle weakness.  No chest pain or dyspnea or palpitations.  No nausea or vomiting or abdominal pain.  No urinary or stool incontinence.  Significantly somnolent during my interview and difficult to arouse except by deep sternal rub.  When aroused he was answering questions slowly but appropriately.  He denied any paresthesias or focal muscle weakness or ataxia.  He denied any vertigo or tinnitus.  No current headache or dizziness or blurred vision or diplopia.  He denied any dysuria, oliguria or hematuria or flank pain.  He stated that he uses his CPAP at 10 cmH2O nightly.  ED Course: When he came to the ER blood pressure was 141/86 with otherwise normal vital signs.  Labs revealed ABG with pH 7.41, bicarbonate 26, PCO2 41, PO2 79 and O2 sat of 95.7% on room air.  Blood glucose was 83 and CMP was within normal.  CBC was unremarkable.  Influenza antigens and COVID-19 PCR came back negative.  Urinalysis was negative.  Alcohol levels less than 10.  Urine drug screen came back negative.  EKG as reviewed by me : Showed normal sinus rhythm with rate of 63 with Q waves anteroseptally Imaging: Noncontrasted head CT scan showed chronic right basal ganglia lacunar infarct and  age indeterminate small vessel ischemic change within the left basal ganglia with no acute hemorrhage.  Brain MRI without contrast revealed.  Head and neck CTA report is currently pending.  Brain MRI without contrast revealed diffusion abnormality involving the left greater than right basal ganglia suggesting acute and/or early subacute ischemia.  Given bilateral basal ganglia involvement, possible toxic metabolic derangement or hypoxic ischemic injury with need to be considered but less favored given the asymmetric nature of these findings.  The patient was given 4 mg of IV Narcan and 2 L bolus of IV normal saline.  She will be admitted to a medically monitored bed for further evaluation and management. PAST MEDICAL HISTORY:  Obstructive sleep apnea, obesity and insomnia as well as dyslipidemia and tobacco abuse.  PAST SURGICAL HISTORY:  Sinus surgery  SOCIAL HISTORY:   Social History   Tobacco Use  . Smoking status: Not on file  . Smokeless tobacco: Not on file  Substance Use Topics  . Alcohol use: Not on file  The patient is a former cigarette smoker.  He quit smoking on 10/07/2001.  He chews smokeless tobacco but occasionally smokes cigarettes to less than half a pack per day.  He drinks 1-2 alcoholic drink per week.  No history of illicit drug use.  FAMILY HISTORY:  History reviewed. No pertinent family history.  He denied any familial diseases.  DRUG ALLERGIES:  Not on File  REVIEW OF SYSTEMS:   ROS As per history of  present illness. All pertinent systems were reviewed above. Constitutional, HEENT, cardiovascular, respiratory, GI, GU, musculoskeletal, neuro, psychiatric, endocrine, integumentary and hematologic systems were reviewed and are otherwise negative/unremarkable except for positive findings mentioned above in the HPI.   MEDICATIONS AT HOME:   Prior to Admission medications   Not on File      VITAL SIGNS:  Blood pressure 137/70, pulse (!) 54, temperature 97.9 F  (36.6 C), temperature source Oral, resp. rate 18, height 6\' 3"  (1.905 m), weight 127 kg, SpO2 96 %.  PHYSICAL EXAMINATION:  Physical Exam  GENERAL:  45 y.o.-year-old obese Caucasian male patient lying in the bed with no acute distress.  He was very difficult to arouse and had to have deep sternal rub.  When aroused he was answering questions slowly but appropriately. EYES: Pupils equal, round, reactive to light and accommodation. No scleral icterus. Extraocular muscles intact.  HEENT: Head atraumatic, normocephalic. Oropharynx and nasopharynx clear.  NECK:  Supple, no jugular venous distention. No thyroid enlargement, no tenderness.  LUNGS: Normal breath sounds bilaterally, no wheezing, rales,rhonchi or crepitation. No use of accessory muscles of respiration.  CARDIOVASCULAR: Regular rate and rhythm, S1, S2 normal. No murmurs, rubs, or gallops.  ABDOMEN: Soft, nondistended, nontender. Bowel sounds present. No organomegaly or mass.  EXTREMITIES: No pedal edema, cyanosis, or clubbing.  NEUROLOGIC: Cranial nerves II through XII are intact. Muscle strength 5/5 in all extremities.  He was moving all 4 extremities.  Sensation intact. Gait not checked.  PSYCHIATRIC: The patient is alert and oriented x 3.  Normal affect and good eye contact. SKIN: No obvious rash, lesion, or ulcer.   LABORATORY PANEL:   CBC Recent Labs  Lab 04/11/20 2145  WBC 9.4  HGB 14.8  HCT 45.1  PLT 250   ------------------------------------------------------------------------------------------------------------------  Chemistries  Recent Labs  Lab 04/11/20 2145  NA 137  K 4.1  CL 103  CO2 27  GLUCOSE 83  BUN 15  CREATININE 1.01  CALCIUM 8.9  AST 22  ALT 34  ALKPHOS 51  BILITOT 1.0   ------------------------------------------------------------------------------------------------------------------  Cardiac Enzymes No results for input(s): TROPONINI in the last 168  hours. ------------------------------------------------------------------------------------------------------------------  RADIOLOGY:  CT Head Wo Contrast  Result Date: 04/11/2020 CLINICAL DATA:  Altered level of consciousness EXAM: CT HEAD WITHOUT CONTRAST TECHNIQUE: Contiguous axial images were obtained from the base of the skull through the vertex without intravenous contrast. COMPARISON:  None. FINDINGS: Brain: Focal hypodensity right basal ganglia consistent with chronic lacunar infarct. Subtle hypodensity within the left basal ganglia images 14 and 15 of series 2 compatible with age indeterminate ischemic change. No signs of acute hemorrhage. Lateral ventricles and remaining midline structures are unremarkable. No acute extra-axial fluid collections. No mass effect. Vascular: No hyperdense vessel or unexpected calcification. Skull: Normal. Negative for fracture or focal lesion. Sinuses/Orbits: No acute finding. Other: None. IMPRESSION: 1. Age-indeterminate small vessel ischemic change within the left basal ganglia. 2. Chronic right basal ganglia lacunar infarct. 3. No acute hemorrhage. Electronically Signed   By: 04/13/2020 M.D.   On: 04/11/2020 23:49   MR BRAIN WO CONTRAST  Result Date: 04/12/2020 CLINICAL DATA:  Initial evaluation for acute mental status change. EXAM: MRI HEAD WITHOUT CONTRAST TECHNIQUE: Multiplanar, multiecho pulse sequences of the brain and surrounding structures were obtained without intravenous contrast. COMPARISON:  Prior CT from 04/11/2020. FINDINGS: Brain: Examination severely limited as the patient was unable to tolerate the full length of the exam. Axial and coronal DWI sequences only were performed. Additionally, the corresponding  ADC sequences are markedly degraded and essentially nondiagnostic. Cerebral volume within normal limits for age. Patchy diffusion abnormality seen involving the left caudate and adjacent left internal capsule, suggesting acute and/or early  subacute ischemia (series 17, images 29, 24). Area of involvement measures up to approximately 2.6 cm in greatest craniocaudad dimension. No significant regional mass effect. No associated hemorrhage visible on prior CT. Additional vague and patchy diffusion abnormality noted involving the contralateral right caudate and lentiform nuclei, less prominent and more subtle as compared to the contralateral left basal ganglia. Finding best seen on coronal DWI sequence (series 19, image 22). Finding also suggestive of possible ischemic change, more subacute in appearance. Area involvement on this side measures no more than 1.5 cm in greatest craniocaudad dimension. No other diffusion abnormality to suggest acute or subacute ischemia seen elsewhere within the brain. Gray-white matter differentiation otherwise grossly maintained. No appreciable mass lesion, mass effect, or midline shift. No hydrocephalus or visible extra-axial fluid collection. Vascular: Not well assessed on this limited exam. Skull and upper cervical spine: Not assessed on this limited exam. Sinuses/Orbits: Not assessed on this limited exam. Other: None. IMPRESSION: 1. Technically limited exam due to the patient's inability to tolerate the full length of the study, with only diffusion weighted sequences performed. 2. Diffusion abnormality involving the left greater than right basal ganglia as above, suggesting acute and/or early subacute ischemia. No significant regional mass effect. Given the bilateral basal ganglia involvement, possible toxic metabolic derangement or hypoxic ischemic injury could also conceivably be considered, although is perhaps less favored given the asymmetric nature of these findings. 3. No other definite intracranial abnormality on this limited exam. Electronically Signed   By: Rise MuBenjamin  McClintock M.D.   On: 04/12/2020 02:59      IMPRESSION AND PLAN:  Active Problems:   Acute CVA (cerebrovascular accident) (HCC)  1.  Acute  and possibly subacute bilateral basal ganglia infarction.  Ischemia leading to infarction could be metabolic, toxic or hypoxic. -The patient will be admitted to a medical monitored bed. -We will follow neuro checks every 4 hours for 24 hours. -We will obtain a 2D echo with bubble study  for further assessment. -PT/OT and ST consults will be obtained. -The patient will be placed on aspirin and statin therapy. -We will check his fasting lipids. -Neurology consult will be obtained. -I notified Dr. Derry LoryKhaliqdina about the patient.  2.  Acute encephalopathy.  This could be metabolic, toxic or hypoxic. -This was assessed by teleneurology and the thought was that it is likely metabolic. -We will follow neuro checks every 4 hours for 24 hours. -Will obtain TSH, vitamin B12 and RPR levels.  3.  Dyslipidemia. -We will continue statin therapy and check fasting lipids.  4.  Obstructive sleep apnea. -We will utilize CPAP nightly at his same home setting of 10 cmH2O.  5.  Ongoing tobacco abuse. -I counseled him for smoking cessation and he will receive further counseling here.  DVT prophylaxis: Lovenox. Code Status: full code. Family Communication:  The plan of care was discussed in details with the patient (and family). I answered all questions. The patient agreed to proceed with the above mentioned plan. Further management will depend upon hospital course. Disposition Plan: Back to previous home environment Consults called: Neurology consult as mentioned above All the records are reviewed and case discussed with ED provider.  Status is: Inpatient  Remains inpatient appropriate because:Altered mental status, Ongoing diagnostic testing needed not appropriate for outpatient work up, Unsafe d/c plan, IV  treatments appropriate due to intensity of illness or inability to take PO and Inpatient level of care appropriate due to severity of illness   Dispo: The patient is from: Home               Anticipated d/c is to: Home              Patient currently is not medically stable to d/c.   Difficult to place patient No   TOTAL TIME TAKING CARE OF THIS PATIENT: 55 minutes.    Hannah Beat M.D on 04/12/2020 at 5:13 AM  [facility]   From 7 PM-7 AM, contact night-coverage [email or web address]  CC: Primary care physician; Patient, No Pcp Per

## 2020-04-12 NOTE — ED Notes (Signed)
Patient somnolent. Responsive to painful stimuli with purposeful movement, but does not fully open eyes or respond to questions/commands. Appears in NAD. MAE, remains on CPAP and CM.
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
## 2020-04-12 NOTE — Progress Notes (Signed)
  PROGRESS NOTE    Joseph Dunlap  ASN:[id number] DOB: 06-14-1975 DOA: 04/11/2020  PCP: Patient, No Pcp Per    LOS - 0    Patient admitted earlier this AM with an acute basal ganglia stroke.  Neurology is following.   Interval subjective: Pt seen in ED holding for a bed. Wife at bedside.  Patient having some "confabulation" in answer questions per his wife.  He says he lives in [facility] (which is where he grew up).  Was reportedly answering questions and not making sense.  Pt denies any acute complaints when seen.  Exam: Awake, alert, NAD, obese, heart sounds RRR, no peripheral edema.  No focal motor deficits.  Normal but delayed speech.  Flat affect, normal mood.   Active Problems:   Acute CVA (cerebrovascular accident) (HCC)    I have reviewed the full H&P by Dr. Arville Care in detail, and I agree with the assessment and plan as outlined therein. In addition: --follow up on neurology recommendations  --follow up Echo & any other pending studies --cleared by SLP, start diet  No Charge    Pennie Banter, DO [facility]   If 7PM-7AM, please contact night-coverage [email or web address] 04/12/2020, 3:29 PM

## 2020-04-12 NOTE — ED Notes (Addendum)
Patient noted to have self removed CPAP. More awake, able to answer questions and respond to commands. Oriented to person and place, disoriented to time and situation. Remains in NAD.

## 2020-04-12 NOTE — Plan of Care (Signed)
Upon shift change, pt's wife stated she wanted her husband to be transferred to [facility].  He's very lethargic and different from baseline. Pt is + for stroke. Only noted deficit is confabulation and lethargy otherwise NIH = 1.  Reached out to Manuela Schwartz, NP. She came up to evaluate pt and speak to wife.  She will put request in.

## 2020-04-12 NOTE — ED Notes (Signed)
Speech at bedside

## 2020-04-12 NOTE — Progress Notes (Addendum)
[facility] contacted me regarding wife request for patient transfer to [facility]. Discussed with wife who reports she know other physicians she is comfortable with there.  She has no concerns with the care he is receiving here.  Request for transfer per family request called to [facility] transfer center. Discussed case with Senior resident Dr. Maxie Better. Patient has been accepted to the waiting list for transfer to neuro stepdown bed  Call back from Dr. Anselm Jungling - [facility] neuro who would like patient to have loading plavix dose and daily maintenance for 3 weeks.

## 2020-04-12 NOTE — ED Notes (Signed)
Neuro at bedside.

## 2020-04-12 NOTE — ED Notes (Signed)
Tele-neurologist on camera assessing patient at this time.

## 2020-04-12 NOTE — ED Notes (Signed)
Report off to stephen rn 

## 2020-04-12 NOTE — ED Notes (Signed)
Pt in mri 

## 2020-04-12 NOTE — ED Notes (Signed)
Pt being placed on CPAP by RT per Dr. Arville Care.

## 2020-04-12 NOTE — ED Notes (Signed)
Dr. Mansy at bedside. 

## 2020-04-12 NOTE — Progress Notes (Signed)
PT Cancellation Note  Patient Details Name: Joseph Dunlap MRN: [id number] DOB: Mar 30, 1975   Cancelled Treatment:    Reason Eval/Treat Not Completed: Other (comment). Spoke with spouse at the bedside. Spouse requesting to hold PT at this time and wants patient to be evaluated by neurology as soon as possible.  Alerted the nurse about spouse concerns. PT will hold at this time and follow up as appropriate.   Donna Bernard, PT, MPT  Ina Homes 04/12/2020, 11:19 AM

## 2020-04-12 NOTE — ED Notes (Signed)
Informed Rn bed assigned [id number]

## 2020-04-12 NOTE — Evaluation (Signed)
Occupational Therapy Evaluation Patient Details Name: Joseph Dunlap MRN: [id number] DOB: November 25, 1975 Today's Date: 04/12/2020    History of Present Illness Joseph Dunlap is a 45 y.o. Caucasian male with medical history significant for dyslipidemia, obstructive sleep apnea and insomnia, who presented to the emergency room with acute onset of altered mental status with decreased responsiveness. No witnessed seizures he was globally confused.  No paresthesias or focal muscle weakness. No chest pain or dyspnea or palpitations.  No nausea or vomiting or abdominal pain. No urinary or stool incontinence. He denied any paresthesias or focal muscle weakness or ataxia. He denied any vertigo or tinnitus.  No current headache or dizziness or blurred vision or diplopia. He denied any dysuria, oliguria or hematuria or flank pain.   Clinical Impression   Joseph Dunlap presents today in ED significantly somnolent, barely arousable to deep sternal rub. Pt nonverbal and did not open eyes throughout evaluation. Per wife, who is present in room, until yesterday pt was active, driving, working full time, running his own business, playing with his children (ages 13 & 66). Today pt displays reflex reaction in b/l feet. He followed command to squeeze fingers with b/l UE, other than that was nonresponsive to any commands. Nurse attempted to get pt to take a drink from a straw, in order to give medications PO, but pt would not open mouth and showed no response when nurse placed straw on pt's lip. No response when given a warm washcloth in hand. No response to questioning re: pain, did not display any signs of pain. If he is able to participate, recommend ongoing OT while pt is hospitalized, given that he is substantially off his baseline. At this point, would have to recommend SNF post D/C.    Follow Up Recommendations  SNF    Equipment Recommendations       Recommendations for Other Services       Precautions /  Restrictions Precautions Precautions: Fall Restrictions Weight Bearing Restrictions: No      Mobility Bed Mobility Overal bed mobility: Needs Assistance Bed Mobility: Rolling Rolling: Total assist              Transfers Overall transfer level: Needs assistance               General transfer comment: unable to attempt, 2/2 pt non-responsiveness    Balance Overall balance assessment: Needs assistance   Sitting balance-Leahy Scale: Zero       Standing balance-Leahy Scale: Zero                             ADL either performed or assessed with clinical judgement   ADL Overall ADL's : Needs assistance/impaired Eating/Feeding: Total assistance   Grooming: Total assistance                                 General ADL Comments: Pt non-responsive to all commands     Vision   Additional Comments:  (unable to assess. Per pt's wife, pt has no visual deficits at baseline)     Perception     Praxis      Pertinent Vitals/Pain Pain Assessment: No/denies pain     Hand Dominance Right   Extremity/Trunk Assessment Upper Extremity Assessment Upper Extremity Assessment: Difficult to assess due to impaired cognition   Lower Extremity Assessment Lower Extremity Assessment: Difficult to assess due to  impaired cognition       Communication Communication Communication: Expressive difficulties;Other (comment) (pt highly somnolent, nonverbal)   Cognition Arousal/Alertness: Lethargic   Overall Cognitive Status: Impaired/Different from baseline                                 General Comments: Per pt's wife, pt is usually active, talkative, is a heavy sleeper but can always be awakened   General Comments       Exercises     Shoulder Instructions      Home Living Family/patient expects to be discharged to:: Private residence Living Arrangements: Spouse/significant other;Children Available Help at Discharge:  Family Type of Home: House Home Access: Stairs to enter Secretary/administrator of Steps: 6   Home Layout: Two level               Home Equipment: Other (comment) (CPAP)          Prior Functioning/Environment Level of Independence: Independent                 OT Problem List: Decreased coordination;Decreased activity tolerance;Decreased cognition;Obesity      OT Treatment/Interventions: Self-care/ADL training;Therapeutic exercise;Patient/family education;Balance training;Therapeutic activities;DME and/or AE instruction    OT Goals(Current goals can be found in the care plan section) Acute Rehab OT Goals Patient Stated Goal: pt unable to state goal Time For Goal Achievement: 04/26/20  OT Frequency: Min 1X/week   Barriers to D/C: Inaccessible home environment          Co-evaluation              AM-PAC OT "6 Clicks" Daily Activity     Outcome Measure Help from another person eating meals?: A Lot Help from another person taking care of personal grooming?: Total Help from another person toileting, which includes using toliet, bedpan, or urinal?: Total Help from another person bathing (including washing, rinsing, drying)?: Total Help from another person to put on and taking off regular upper body clothing?: Total Help from another person to put on and taking off regular lower body clothing?: Total 6 Click Score: 7   End of Session    Activity Tolerance: Treatment limited secondary to medical complications (Comment);Patient limited by lethargy Patient left: in bed;with nursing/sitter in room;with family/visitor present  OT Visit Diagnosis: Muscle weakness (generalized) (M62.81);Other symptoms and signs involving the nervous system (R29.898)                Time: 5366-4403 OT Time Calculation (min): 17 min Charges:  OT General Charges $OT Visit: 1 Visit OT Evaluation $OT Eval Moderate Complexity: 1 Mod OT Treatments $Self Care/Home Management : 8-22  mins  Latina Craver, PhD, MS, OTR/L ascom [phone] 04/12/20, 10:30 AM

## 2020-04-12 NOTE — ED Notes (Signed)
Pt awake now  md at bedside.

## 2020-04-12 NOTE — Progress Notes (Signed)
Speech Language Pathology Evaluation Patient Details Name: Keymani Mclean MRN: [id number] DOB: 1975-02-19 Today's Date: 04/12/2020 Time: 9211-9417 SLP Time Calculation (min) (ACUTE ONLY): 30 min  Problem List:  Patient Active Problem List   Diagnosis Date Noted   Acute CVA (cerebrovascular accident) (HCC) 04/12/2020   Past Medical History:  Past Medical History:  Diagnosis Date   Sleep apnea    Past Surgical History: History reviewed. No pertinent surgical history. HPI:  Patient is a 45 y.o. male with hx HLD, OSA, insomnia who presented with AMS, decreased responsiveness. UDS negative. MRI showed acute CVA of left basal ganglia and chronic right basal ganglia lacunar infarct. Neurology has been consulted.   Assessment / Plan / Recommendation Clinical Impression   Patient presents with significant cognitive communication impairments with unusual presentation not typical of CVA. Oral motor examination is WNL. RN reported oral holding when swallowing this morning when pt was lethargic, however per wife pt fed himself lunch without difficulties today. Patient is alert, following simple commands, and responding to simple questions but is highly confabulatory. Slow processing noted. Stated name of his company was "[employer]", states location as "[place], [place]," however when asked what state he is in stated, "[place] Sustained attention is impaired, with pt needing repetition of multi-step commands or simple instructions during cognitive tasks. Immediate recall of 5/5 words, however when asked to recall these after delay, pt named completely unrelated, random words (instead of apple, pen, tie, house, car, he named waterski, water, cork, water-caulk, paint). Clock drawing is highly disorganized with numbers 12 and 1 placed outside of the clock, no other numbers present, and 32 marks along the edge of clock. Story retell was confabulatory. He scored 3/30 on SLUMS  assessment. Pt would benefit from skilled ST addressing attention, memory and awareness, and eventually higher level cognitive functions. Will follow acutely.     SLP Assessment  SLP Recommendation/Assessment: Patient needs continued Speech Lanaguage Pathology Services SLP Visit Diagnosis: Cognitive communication deficit (R41.841)    Follow Up Recommendations  Other (comment) (tbd)    Frequency and Duration min 2 x/week  2 weeks      SLP Evaluation Cognition  Overall Cognitive Status: Impaired/Different from baseline Arousal/Alertness: Awake/alert Orientation Level: Oriented to person;Disoriented to place;Oriented to time;Disoriented to situation Attention: Sustained Sustained Attention: Impaired Sustained Attention Impairment: Verbal basic Memory: Impaired Memory Impairment: Decreased recall of new information;Decreased short term memory Decreased Short Term Memory: Verbal basic Immediate Memory Recall:  (5/5 words immediate recall, 0/5 delayed (confabulation present)) Awareness: Impaired Awareness Impairment: Intellectual impairment Problem Solving: Impaired Problem Solving Impairment: Verbal basic (0/2 simple functional math) Executive Function: Sequencing;Self Monitoring;Organizing Sequencing: Impaired Sequencing Impairment: Verbal basic (3 digit number with delay, x 4 digit or higher) Organizing: Impaired Organizing Impairment: Verbal basic (clock drawing impaired; no numbers, 34 lines) Self Monitoring: Impaired Self Monitoring Impairment: Verbal basic Behaviors: Confabulation;Perseveration Safety/Judgment: Impaired       Comprehension  Auditory Comprehension Overall Auditory Comprehension: Impaired (for mod complex material, due to attention) Yes/No Questions: Within Functional Limits (simple Y/n) Commands: Impaired One Step Basic Commands: 75-100% accurate Multistep Basic Commands: 25-49% accurate Conversation: Simple Interfering Components: Attention Visual  Recognition/Discrimination Discrimination: Not tested Reading Comprehension Reading Status: Not tested    Expression Expression Primary Mode of Expression: Verbal Verbal Expression Overall Verbal Expression: Impaired Initiation: Impaired (slow processing but does respond with extra time) Automatic Speech: Name;Social Response Level of Generative/Spontaneous Verbalization: Conversation Repetition: Impaired Level of Impairment: Sentence level Naming: Impairment Confrontation:  Not tested Divergent: 0-24% accurate (3 animals in 60 seconds) Verbal Errors: Not aware of errors Pragmatics: No impairment Interfering Components: Attention Non-Verbal Means of Communication: Not applicable Other Verbal Expression Comments: very confabulatory Written Expression Dominant Hand: Right Written Expression: Not tested   Oral / Motor  Oral Motor/Sensory Function Overall Oral Motor/Sensory Function: Within functional limits Motor Speech Overall Motor Speech: Appears within functional limits for tasks assessed Respiration: Within functional limits Articulation: Within functional limitis Intelligibility: Intelligible   GO                   Rondel Baton, MS, CCC-SLP Speech-Language Pathologist  Arlana Lindau 04/12/2020, 2:51 PM

## 2020-04-13 ENCOUNTER — Inpatient Hospital Stay
Admit: 2020-04-13 | Discharge: 2020-04-13 | Disposition: A | Discharge status: Home / Self Care | Payer: 59 | Attending: Family Medicine | Admitting: Family Medicine

## 2020-04-13 DIAGNOSIS — I639 Cerebral infarction, unspecified: Secondary | ICD-10-CM

## 2020-04-13 DIAGNOSIS — G4733 Obstructive sleep apnea (adult) (pediatric): Secondary | ICD-10-CM

## 2020-04-13 DIAGNOSIS — E669 Obesity, unspecified: Secondary | ICD-10-CM

## 2020-04-13 DIAGNOSIS — Z9989 Dependence on other enabling machines and devices: Secondary | ICD-10-CM | POA: Diagnosis not present

## 2020-04-13 DIAGNOSIS — G9340 Encephalopathy, unspecified: Secondary | ICD-10-CM | POA: Diagnosis not present

## 2020-04-13 LAB — ECHOCARDIOGRAM COMPLETE
AR max vel: 3.39 cm2
AV Area VTI: 3.52 cm2
AV Area mean vel: 3.55 cm2
AV Mean grad: 4 mmHg
AV Peak grad: 7.4 mmHg
Ao pk vel: 1.36 m/s
Area-P 1/2: 3.68 cm2
Calc EF: 62.2 %
Height: 75 in
MV VTI: 3.32 cm2
Single Plane A2C EF: 63 %
Single Plane A4C EF: 62.9 %
Weight: 4480 oz

## 2020-04-13 LAB — HEMOGLOBIN A1C
Hgb A1c MFr Bld: 5.4 % (ref 4.8–5.6)
Mean Plasma Glucose: 108.28 mg/dL

## 2020-04-13 LAB — LIPID PANEL
Cholesterol: 238 mg/dL — ABNORMAL HIGH (ref 0–200)
HDL: 26 mg/dL — ABNORMAL LOW (ref >40)
LDL Cholesterol: 184 mg/dL — ABNORMAL HIGH (ref 0–99)
Total CHOL/HDL Ratio: 9.2 RATIO
Triglycerides: 138 mg/dL (ref <150)
VLDL: 28 mg/dL (ref 0–40)

## 2020-04-13 LAB — BLOOD GAS, ARTERIAL
Acid-Base Excess: 1.2 mmol/L (ref 0.0–2.0)
Bicarbonate: 26 mmol/L (ref 20.0–28.0)
O2 Saturation: 95.7 %
Patient temperature: 37
pCO2 arterial: 41 mmHg (ref 32.0–48.0)
pH, Arterial: 7.41 (ref 7.350–7.450)
pO2, Arterial: 79 mmHg — ABNORMAL LOW (ref 83.0–108.0)

## 2020-04-13 LAB — CBC
HCT: 42.3 % (ref 39.0–52.0)
Hemoglobin: 13.8 g/dL (ref 13.0–17.0)
MCH: 29.2 pg (ref 26.0–34.0)
MCHC: 32.6 g/dL (ref 30.0–36.0)
MCV: 89.4 fL (ref 80.0–100.0)
Platelets: 221 10*3/uL (ref 150–400)
RBC: 4.73 MIL/uL (ref 4.22–5.81)
RDW: 12.6 % (ref 11.5–15.5)
WBC: 8 10*3/uL (ref 4.0–10.5)
nRBC: 0 % (ref 0.0–0.2)

## 2020-04-13 LAB — BASIC METABOLIC PANEL
Anion gap: 9 (ref 5–15)
BUN: 13 mg/dL (ref 6–20)
CO2: 27 mmol/L (ref 22–32)
Calcium: 9.1 mg/dL (ref 8.9–10.3)
Chloride: 105 mmol/L (ref 98–111)
Creatinine, Ser: 1.22 mg/dL (ref 0.61–1.24)
GFR, Estimated: >60 mL/min (ref >60)
Glucose, Bld: 100 mg/dL — ABNORMAL HIGH (ref 70–99)
Potassium: 4.5 mmol/L (ref 3.5–5.1)
Sodium: 141 mmol/L (ref 135–145)

## 2020-04-13 LAB — ANA W/REFLEX IF POSITIVE: Anti Nuclear Antibody (ANA): NEGATIVE

## 2020-04-13 LAB — RPR: RPR Ser Ql: NONREACTIVE

## 2020-04-13 LAB — ANCA TITERS
Atypical P-ANCA titer: <1:20 {titer}
C-ANCA: <1:20 {titer}
P-ANCA: <1:20 {titer}

## 2020-04-13 LAB — VOLATILES,BLD-ACETONE,ETHANOL,ISOPROP,METHANOL
Acetone, blood: <0.01 g/dL (ref 0.000–0.010)
Ethanol, blood: <0.01 g/dL (ref 0.000–0.010)
Isopropanol, blood: <0.01 g/dL (ref 0.000–0.010)
Methanol, blood: <0.01 g/dL (ref 0.000–0.010)

## 2020-04-13 LAB — PROTEIN S ACTIVITY: Protein S Activity: 78 % (ref 63–140)

## 2020-04-13 LAB — PROTEIN C ACTIVITY: Protein C Activity: 113 % (ref 73–180)

## 2020-04-13 LAB — GLUCOSE, CAPILLARY
Glucose-Capillary: 101 mg/dL — ABNORMAL HIGH (ref 70–99)
Glucose-Capillary: 137 mg/dL — ABNORMAL HIGH (ref 70–99)
Glucose-Capillary: 104 mg/dL — ABNORMAL HIGH (ref 70–99)

## 2020-04-13 LAB — ANTI-DNA ANTIBODY, DOUBLE-STRANDED: ds DNA Ab: <1 IU/mL (ref 0–9)

## 2020-04-13 MED ORDER — ASPIRIN 81 MG PO TBEC: 81.0000 mg | DELAYED_RELEASE_TABLET | Freq: Every day | ORAL | 11 refills | Status: AC

## 2020-04-13 MED ORDER — ADULT MULTIVITAMIN W/MINERALS CH: 1.0000 | ORAL_TABLET | Freq: Every day | ORAL | Status: AC

## 2020-04-13 MED ORDER — ENOXAPARIN SODIUM 80 MG/0.8ML ~~LOC~~ SOLN: 65.0000 mg | SUBCUTANEOUS | Status: AC

## 2020-04-13 MED ORDER — CYANOCOBALAMIN 1000 MCG PO TABS: 1000.0000 ug | ORAL_TABLET | Freq: Every day | ORAL | Status: AC

## 2020-04-13 MED ORDER — PERFLUTREN LIPID MICROSPHERE
1.0000 mL | INTRAVENOUS | Status: AC | PRN
  Administered 2020-04-13: 2 mL via INTRAVENOUS
  Filled 2020-04-13: qty 10

## 2020-04-13 MED ORDER — MAGNESIUM HYDROXIDE 400 MG/5ML PO SUSP: 30.0000 mL | Freq: Every day | ORAL | 0 refills | Status: AC | PRN

## 2020-04-13 MED ORDER — CLOPIDOGREL BISULFATE 75 MG PO TABS: 75.0000 mg | ORAL_TABLET | Freq: Every day | ORAL | Status: AC

## 2020-04-13 MED ORDER — THIAMINE HCL 100 MG/ML IJ SOLN: 500.0000 mg | Freq: Three times a day (TID) | INTRAVENOUS | Status: AC

## 2020-04-13 MED ORDER — ADULT MULTIVITAMIN W/MINERALS CH
1.0000 | ORAL_TABLET | Freq: Every day | ORAL | Status: DC
  Administered 2020-04-13: 10:00:00 1 via ORAL
  Filled 2020-04-13: qty 1

## 2020-04-13 MED ORDER — TRAZODONE HCL 50 MG PO TABS: 25.0000 mg | ORAL_TABLET | Freq: Every evening | ORAL | Status: AC | PRN

## 2020-04-13 MED ORDER — ATORVASTATIN CALCIUM 80 MG PO TABS: 80.0000 mg | ORAL_TABLET | Freq: Every day | ORAL | Status: AC

## 2020-04-13 MED ORDER — ATORVASTATIN CALCIUM 20 MG PO TABS
80.0000 mg | ORAL_TABLET | Freq: Every day | ORAL | Status: DC
  Administered 2020-04-13: 10:00:00 80 mg via ORAL
  Filled 2020-04-13: qty 4

## 2020-04-13 MED ORDER — ONDANSETRON HCL 4 MG PO TABS: 4.0000 mg | ORAL_TABLET | Freq: Four times a day (QID) | ORAL | 0 refills | Status: AC | PRN

## 2020-04-13 MED ORDER — ACETAMINOPHEN 325 MG PO TABS: 650.0000 mg | ORAL_TABLET | Freq: Four times a day (QID) | ORAL | Status: AC | PRN

## 2020-04-13 MED ORDER — FAMOTIDINE 20 MG PO TABS: 20.0000 mg | ORAL_TABLET | Freq: Every day | ORAL | Status: AC

## 2020-04-13 NOTE — Progress Notes (Signed)
*  PRELIMINARY RESULTS* Echocardiogram 2D Echocardiogram has been performed.  Joseph Dunlap 04/13/2020, 9:31 AM

## 2020-04-13 NOTE — TOC Transition Note (Signed)
Transition of Care [facility]) - CM/SW Discharge Note   Patient Details  Name: Joseph Dunlap MRN: [id number] Date of Birth: February 18, 1975  [facility] CM/SW Contact:  Allayne Butcher, RN Phone Number: 04/13/2020, 1:41 PM   Clinical Narrative:    Patient is being transferred to [facility] room 8A 05.  [facility] transport will transport patient to [facility].  Bedside RN has already called report.  EMS packet is ready except for [place] that will be printed out once EMS is in route.    Final next level of care: Acute to Acute Transfer Barriers to Discharge: No Barriers Identified   Patient Goals and CMS Choice   CMS Medicare.gov Compare Post Acute Care list provided to:: Patient Represenative (must comment) Choice offered to / list presented to : Spouse  Discharge Placement                       Discharge Plan and Services   Discharge Planning Services: NA Post Acute Care Choice: NA [facility])                               Social Determinants of Health (SDOH) Interventions     Readmission Risk Interventions No flowsheet data found.

## 2020-04-13 NOTE — Progress Notes (Addendum)
Merideth from the tranfer center called and stated they had a bed ready at [facility] [address] [place], [place] [address], ROOM [phone] and phone number [phone]. Made patients wife and MD aware of same.

## 2020-04-13 NOTE — Progress Notes (Signed)
Called report to [facility] ROOM 8A05. Gave report to [place], [place], [phone].

## 2020-04-13 NOTE — Plan of Care (Signed)
Tried to do q2 hr neuro check - pt is very hard to wake up.  Wearing his cpap.  Asked wife if he's a deep sleeper - answered affirmatively.  Performed the thumbnail pressure test and he responded by grimacing and pushed my hand away.  Repeatedly tried to rouse him.  Told his wife we'd try again in an hour, and might have to be more aggressive.

## 2020-04-13 NOTE — Discharge Summary (Signed)
Physician Discharge Summary  [facility] 121 ZOX:[id number] DOB: 1975-04-21 DOA: 04/11/2020  PCP: Patient, No Pcp Per  Admit date: 04/11/2020 Discharge date: 04/13/2020  Admitted From: Home Disposition: Transferred to [facility]   Discharge Condition: Stable CODE STATUS: Full code   HPI: Per [name] 45 y.o. Caucasian male with medical history significant for dyslipidemia, obstructive sleep apnea and insomnia, who presented to the emergency room with acute onset of altered mental status with decreased responsiveness.  The patient was arousable to sternal rub by EMS.  No witnessed seizures he was globally confused.  No paresthesias or focal muscle weakness.  No chest pain or dyspnea or palpitations.  No nausea or vomiting or abdominal pain.  No urinary or stool incontinence.  Significantly somnolent during my interview and difficult to arouse except by deep sternal rub.  When aroused he was answering questions slowly but appropriately.  He denied any paresthesias or focal muscle weakness or ataxia.  He denied any vertigo or tinnitus.  No current headache or dizziness or blurred vision or diplopia.  He denied any dysuria, oliguria or hematuria or flank pain.  He stated that he uses his CPAP at 10 cmH2O nightly.  ED Course: When he came to the ER blood pressure was 141/86 with otherwise normal vital signs.  Labs revealed ABG with pH 7.41, bicarbonate 26, PCO2 41, PO2 79 and O2 sat of 95.7% on room air.  Blood glucose was 83 and CMP was within normal.  CBC was unremarkable.  Influenza antigens and COVID-19 PCR came back negative.  Urinalysis was negative.  Alcohol levels less than 10.  Urine drug screen came back negative.  EKG as reviewed by me : Showed normal sinus rhythm with rate of 63 with Q waves anteroseptally Imaging: Noncontrasted head CT scan showed chronic right basal ganglia lacunar infarct and age indeterminate small vessel ischemic change within the left basal ganglia with  no acute hemorrhage.  Brain MRI without contrast revealed.  Head and neck CTA report is currently pending.  Brain MRI without contrast revealed diffusion abnormality involving the left greater than right basal ganglia suggesting acute and/or early subacute ischemia.  Given bilateral basal ganglia involvement, possible toxic metabolic derangement or hypoxic ischemic injury with need to be considered but less favored given the asymmetric nature of these findings.  The patient was given 4 mg of IV Narcan and 2 L bolus of IV normal saline.  She will be admitted to a medically monitored bed for further evaluation and management.  Hospital Course: Patient admitted with acute encephalopathy which appears to be waxing or waning per patient's wife.  MRI brain revealed diffusion abnormality involving the left greater than right basal ganglia suggesting acute and/or early subacute ischemia.  CTA head and neck without flow obstructing or significant stenosis. Neurology consulted and ordered UDS, Cox panel, B1 level, B12, volatile's, methylmalonic acid level, EEG and hypercaog labs including ANA with reflex to IFA, ANCA titers, APLA Ab panel, AT3, FVL, MTHFR, Protein C and S activity, ESR which are pending.  Family requested transfer to [facility] for further evaluation and care.  He was accepted by Dr. Adair Patter.  At the time of transfer, patient is awake and alert.  He is oriented to self, "hospital" and person.  His neuro exam is within normal range.  See individual problem list below for more on hospital course.  Discharge Diagnoses:  Acute/subacute bilateral basal ganglia CVA-MRI findings as above.  He has no focal neuro deficit on exam.  I do not think this would explain his presentation.   -Follow the above labs and TTE -Continue aspirin, Plavix and statin therapy -Continue permissive hypertension -Further evaluation and care at the [facility].  Acute encephalopathy-seems to be waxing or waning per patient's wife.  Basic  encephalopathy labs including UDS, Z16, folic acid, blood gas, ammonia, TSH and RPR without significant finding. -Follow additional encephalopathy work-up as above  Sinus bradycardia-EKG sinus rhythm without AV block.  Not on nodal blocking agent.  Improved with activity.  Dyslipidemia -Continue statin.  OSA on CPAP-compliant. -Continue nightly CPAP  Tobacco use disorder -Encourage tobacco cessation -Nicotine patch  Class II obesity -Encourage lifestyle change to lose weight.   Body mass index is 35 kg/m.            Discharge Exam: Vitals:   04/13/20 0512 04/13/20 0744  BP: 130/78 108/81  Pulse: (!) 53 (!) 51  Resp: 16 18  Temp: 98.3 F (36.8 C) (!) 97.4 F (36.3 C)  SpO2: 98% 100%    GENERAL: No apparent distress.  Nontoxic. HEENT: MMM.  Vision and hearing grossly intact.  NECK: Supple.  No apparent JVD.  RESP: On RA.  No IWOB.  Fair aeration bilaterally. CVS:  RRR. Heart sounds normal.  ABD/GI/GU: Bowel sounds present. Soft. Non tender.  MSK/EXT:  Moves extremities. No apparent deformity. No edema.  SKIN: no apparent skin lesion or wound NEURO: Awake and alert.  Oriented to self, person and "hospital".  Awake, alert and oriented appropriately. Speech clear. Cranial nerves II-XII intact. Motor 5/5 in all muscle groups of UE and LE bilaterally, Normal tone. Light sensation intact in all dermatomes of upper and lower ext bilaterally. Patellar reflex symmetric.  No pronator drift.  Finger to nose intact. PSYCH: Calm. Normal affect.   Discharge Instructions   Allergies as of 04/13/2020      Reactions   Benadryl [diphenhydramine] Other (See Comments)   Makes patient lethargic   Other Other (See Comments)   Per wife, Antihistamines such as benadryl/zyrtec or meds with the possibility of causing drowsiness will make patient lethargic.     Med Rec must be completed prior to using this [facility]       Consultations:  Neurology  Procedures/Studies:  2D  Echo on 04/13/2020 pending   CT Angio Head W or Wo Contrast  Result Date: 04/12/2020 CLINICAL DATA:  Drug overdose.  Difficult to arouse EXAM: CT ANGIOGRAPHY HEAD AND NECK TECHNIQUE: Multidetector CT imaging of the head and neck was performed using the standard protocol during bolus administration of intravenous contrast. Multiplanar CT image reconstructions and MIPs were obtained to evaluate the vascular anatomy. Carotid stenosis measurements (when applicable) are obtained utilizing NASCET criteria, using the distal internal carotid diameter as the denominator. CONTRAST:  33m OMNIPAQUE IOHEXOL 350 MG/ML SOLN COMPARISON:  Brain MRI from earlier today FINDINGS: CTA NECK FINDINGS Aortic arch: Standard branching. Imaged portion shows no evidence of aneurysm or dissection. No significant stenosis of the major arch vessel origins. Right carotid system: No evidence of dissection, stenosis (50% or greater) or occlusion. Left carotid system: No evidence of dissection, stenosis (50% or greater) or occlusion. Vertebral arteries: Codominant. No evidence of dissection, stenosis (50% or greater) or occlusion. Skeleton: Unremarkable Other neck: Unremarkable Upper chest: Negative Review of the MIP images confirms the above findings CTA HEAD FINDINGS Anterior circulation: No significant stenosis, proximal occlusion, aneurysm, or vascular malformation. Minimal medium size vessel atheromatous changes may be detectable on the MIP images. No beading. Posterior circulation:  No significant stenosis, proximal occlusion, aneurysm, or vascular malformation. Minimal medium size vessel atheromatous changes may be detectable on the MIP images. No beading. Venous sinuses: As permitted by contrast timing, patent. Anatomic variants: Negative Review of the MIP images confirms the above findings IMPRESSION: No emergent finding or flow limiting stenosis. No evident cause for acute perforator infarcts. Electronically Signed   By: Monte Fantasia  M.D.   On: 04/12/2020 05:38   CT Head Wo Contrast  Result Date: 04/11/2020 CLINICAL DATA:  Altered level of consciousness EXAM: CT HEAD WITHOUT CONTRAST TECHNIQUE: Contiguous axial images were obtained from the base of the skull through the vertex without intravenous contrast. COMPARISON:  None. FINDINGS: Brain: Focal hypodensity right basal ganglia consistent with chronic lacunar infarct. Subtle hypodensity within the left basal ganglia images 14 and 15 of series 2 compatible with age indeterminate ischemic change. No signs of acute hemorrhage. Lateral ventricles and remaining midline structures are unremarkable. No acute extra-axial fluid collections. No mass effect. Vascular: No hyperdense vessel or unexpected calcification. Skull: Normal. Negative for fracture or focal lesion. Sinuses/Orbits: No acute finding. Other: None. IMPRESSION: 1. Age-indeterminate small vessel ischemic change within the left basal ganglia. 2. Chronic right basal ganglia lacunar infarct. 3. No acute hemorrhage. Electronically Signed   By: Randa Ngo M.D.   On: 04/11/2020 23:49   CT Angio Neck W and/or Wo Contrast  Result Date: 04/12/2020 CLINICAL DATA:  Drug overdose.  Difficult to arouse EXAM: CT ANGIOGRAPHY HEAD AND NECK TECHNIQUE: Multidetector CT imaging of the head and neck was performed using the standard protocol during bolus administration of intravenous contrast. Multiplanar CT image reconstructions and MIPs were obtained to evaluate the vascular anatomy. Carotid stenosis measurements (when applicable) are obtained utilizing NASCET criteria, using the distal internal carotid diameter as the denominator. CONTRAST:  87m OMNIPAQUE IOHEXOL 350 MG/ML SOLN COMPARISON:  Brain MRI from earlier today FINDINGS: CTA NECK FINDINGS Aortic arch: Standard branching. Imaged portion shows no evidence of aneurysm or dissection. No significant stenosis of the major arch vessel origins. Right carotid system: No evidence of dissection,  stenosis (50% or greater) or occlusion. Left carotid system: No evidence of dissection, stenosis (50% or greater) or occlusion. Vertebral arteries: Codominant. No evidence of dissection, stenosis (50% or greater) or occlusion. Skeleton: Unremarkable Other neck: Unremarkable Upper chest: Negative Review of the MIP images confirms the above findings CTA HEAD FINDINGS Anterior circulation: No significant stenosis, proximal occlusion, aneurysm, or vascular malformation. Minimal medium size vessel atheromatous changes may be detectable on the MIP images. No beading. Posterior circulation: No significant stenosis, proximal occlusion, aneurysm, or vascular malformation. Minimal medium size vessel atheromatous changes may be detectable on the MIP images. No beading. Venous sinuses: As permitted by contrast timing, patent. Anatomic variants: Negative Review of the MIP images confirms the above findings IMPRESSION: No emergent finding or flow limiting stenosis. No evident cause for acute perforator infarcts. Electronically Signed   By: JMonte FantasiaM.D.   On: 04/12/2020 05:38   MR BRAIN WO CONTRAST  Result Date: 04/12/2020 CLINICAL DATA:  Initial evaluation for acute mental status change. EXAM: MRI HEAD WITHOUT CONTRAST TECHNIQUE: Multiplanar, multiecho pulse sequences of the brain and surrounding structures were obtained without intravenous contrast. COMPARISON:  Prior CT from 04/11/2020. FINDINGS: Brain: Examination severely limited as the patient was unable to tolerate the full length of the exam. Axial and coronal DWI sequences only were performed. Additionally, the corresponding ADC sequences are markedly degraded and essentially nondiagnostic. Cerebral volume within normal limits  for age. Patchy diffusion abnormality seen involving the left caudate and adjacent left internal capsule, suggesting acute and/or early subacute ischemia (series 17, images 29, 24). Area of involvement measures up to approximately 2.6 cm  in greatest craniocaudad dimension. No significant regional mass effect. No associated hemorrhage visible on prior CT. Additional vague and patchy diffusion abnormality noted involving the contralateral right caudate and lentiform nuclei, less prominent and more subtle as compared to the contralateral left basal ganglia. Finding best seen on coronal DWI sequence (series 19, image 22). Finding also suggestive of possible ischemic change, more subacute in appearance. Area involvement on this side measures no more than 1.5 cm in greatest craniocaudad dimension. No other diffusion abnormality to suggest acute or subacute ischemia seen elsewhere within the brain. Gray-white matter differentiation otherwise grossly maintained. No appreciable mass lesion, mass effect, or midline shift. No hydrocephalus or visible extra-axial fluid collection. Vascular: Not well assessed on this limited exam. Skull and upper cervical spine: Not assessed on this limited exam. Sinuses/Orbits: Not assessed on this limited exam. Other: None. IMPRESSION: 1. Technically limited exam due to the patient's inability to tolerate the full length of the study, with only diffusion weighted sequences performed. 2. Diffusion abnormality involving the left greater than right basal ganglia as above, suggesting acute and/or early subacute ischemia. No significant regional mass effect. Given the bilateral basal ganglia involvement, possible toxic metabolic derangement or hypoxic ischemic injury could also conceivably be considered, although is perhaps less favored given the asymmetric nature of these findings. 3. No other definite intracranial abnormality on this limited exam. Electronically Signed   By: Jeannine Boga M.D.   On: 04/12/2020 02:59   DG Chest Portable 1 View  Result Date: 04/12/2020 CLINICAL DATA:  Altered mental status EXAM: PORTABLE CHEST 1 VIEW COMPARISON:  10/13/2009 FINDINGS: Low volume chest. There is no edema, consolidation,  effusion, or pneumothorax. Normal heart size and mediastinal contours for technique. IMPRESSION: Low volume chest without edema or infiltrate. Electronically Signed   By: Monte Fantasia M.D.   On: 04/12/2020 05:39        The results of significant diagnostics from this hospitalization (including imaging, microbiology, ancillary and laboratory) are listed below for reference.     Microbiology: Recent Results (from the past 240 hour(s))  Resp Panel by RT-PCR (Flu A&B, Covid) Nasopharyngeal Swab     Status: None   Collection Time: 04/12/20  3:57 AM   Specimen: Nasopharyngeal Swab; Nasopharyngeal(NP) swabs in vial transport medium  Result Value Ref Range Status   SARS Coronavirus 2 by RT PCR NEGATIVE NEGATIVE Final    Comment: (NOTE) SARS-CoV-2 target nucleic acids are NOT DETECTED.  The SARS-CoV-2 RNA is generally detectable in upper respiratory specimens during the acute phase of infection. The lowest concentration of SARS-CoV-2 viral copies this assay can detect is 138 copies/mL. A negative result does not preclude SARS-Cov-2 infection and should not be used as the sole basis for treatment or other patient management decisions. A negative result may occur with  improper specimen collection/handling, submission of specimen other than nasopharyngeal swab, presence of viral mutation(s) within the areas targeted by this assay, and inadequate number of viral copies(<138 copies/mL). A negative result must be combined with clinical observations, patient history, and epidemiological information. The expected result is Negative.  Fact Sheet for Patients:  EntrepreneurPulse.com.au  Fact Sheet for Healthcare Providers:  IncredibleEmployment.be  This test is no t yet approved or cleared by the [place] and  has been authorized for  detection and/or diagnosis of SARS-CoV-2 by FDA under an Emergency Use Authorization (EUA). This EUA will remain   in effect (meaning this test can be used) for the duration of the COVID-19 declaration under Section 564(b)(1) of the Act, 21 U.S.C.section 360bbb-3(b)(1), unless the authorization is terminated  or revoked sooner.       Influenza A by PCR NEGATIVE NEGATIVE Final   Influenza B by PCR NEGATIVE NEGATIVE Final    Comment: (NOTE) The Xpert Xpress SARS-CoV-2/FLU/RSV plus assay is intended as an aid in the diagnosis of influenza from Nasopharyngeal swab specimens and should not be used as a sole basis for treatment. Nasal washings and aspirates are unacceptable for Xpert Xpress SARS-CoV-2/FLU/RSV testing.  Fact Sheet for Patients: EntrepreneurPulse.com.au  Fact Sheet for Healthcare Providers: IncredibleEmployment.be  This test is not yet approved or cleared by the [place] FDA and has been authorized for detection and/or diagnosis of SARS-CoV-2 by FDA under an Emergency Use Authorization (EUA). This EUA will remain in effect (meaning this test can be used) for the duration of the COVID-19 declaration under Section 564(b)(1) of the Act, 21 U.S.C. section 360bbb-3(b)(1), unless the authorization is terminated or revoked.  Performed at [facility], [address] [place],  [address]      Labs:  CBC: Recent Labs  Lab 04/11/20 2145 04/13/20 0553  WBC 9.4 8.0  NEUTROABS 5.7  --   HGB 14.8 13.8  HCT 45.1 42.3  MCV 89.5 89.4  PLT 250 221   BMP &GFR Recent Labs  Lab 04/11/20 2145 04/12/20 2103 04/13/20 0553  NA 137 135 141  K 4.1 4.1 4.5  CL 103 102 105  CO2 27 27 27   GLUCOSE 83 116* 100*  BUN 15 15 13   CREATININE 1.01 1.13 1.22  CALCIUM 8.9 9.0 9.1  MG  --  1.9  --    Estimated Creatinine Clearance: 108.6 mL/min (by C-G formula based on SCr of 1.22 mg/dL). Liver & Pancreas: Recent Labs  Lab 04/11/20 2145  AST 22  ALT 34  ALKPHOS 51  BILITOT 1.0  PROT 7.7  ALBUMIN 4.0   No results for input(s):  LIPASE, AMYLASE in the last 168 hours. Recent Labs  Lab 04/12/20 0431  AMMONIA 14   Diabetic: Recent Labs    04/13/20 0553  HGBA1C 5.4   Recent Labs  Lab 04/12/20 2008 04/12/20 2103 04/12/20 2351 04/13/20 0742  GLUCAP 61* 125* 100* 101*   Cardiac Enzymes: No results for input(s): CKTOTAL, CKMB, CKMBINDEX, TROPONINI in the last 168 hours. No results for input(s): PROBNP in the last 8760 hours. Coagulation Profile: No results for input(s): INR, PROTIME in the last 168 hours. Thyroid Function Tests: Recent Labs    04/12/20 0431  TSH 1.655   Lipid Profile: Recent Labs    04/13/20 0553  CHOL 238*  HDL 26*  LDLCALC 184*  TRIG 138  CHOLHDL 9.2   Anemia Panel: Recent Labs    04/12/20 0233 04/12/20 1442  VITAMINB12 289  --   FOLATE  --  11.4   Urine analysis:    Component Value Date/Time   COLORURINE YELLOW (A) 04/12/2020 0230   APPEARANCEUR CLEAR (A) 04/12/2020 0230   LABSPEC 1.021 04/12/2020 0230   PHURINE 5.0 04/12/2020 0230   GLUCOSEU NEGATIVE 04/12/2020 0230   HGBUR NEGATIVE 04/12/2020 0230   BILIRUBINUR NEGATIVE 04/12/2020 0230   KETONESUR NEGATIVE 04/12/2020 0230   PROTEINUR NEGATIVE 04/12/2020 0230   NITRITE NEGATIVE 04/12/2020 0230   LEUKOCYTESUR NEGATIVE 04/12/2020  0230   Sepsis Labs: Invalid input(s): PROCALCITONIN, LACTICIDVEN   Time coordinating discharge: 40 minutes  SIGNED:  Mercy Riding, MD  [facility] 04/13/2020, 11:38 AM  If 7PM-7AM, please contact night-coverage [email or web address]

## 2020-04-13 NOTE — Progress Notes (Signed)
eeg done °

## 2020-04-13 NOTE — Evaluation (Signed)
Physical Therapy Evaluation Patient Details Name: Joseph Dunlap MRN: [id number] DOB: [birth year] Today's Date: 04/13/2020   History of Present Illness  Pt is a 45 y.o. male with PMH significant for OSA who presents with acute onset hypersomnolence and predominant cognitive and attention deficit. 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    Clinical Impression  Pt in bed, alert with family at bedside. Pt oriented to self, disoriented to self, place situation. Originally reported to PT that he did not know the answers, but during session with varying staff/family, often exhibited confabulation. Decreased short term memory, unable to recall several details after being re-oriented within a few minutes. Pt did tend to provide information that seemed relevant to long term memory such as previous presidents, wife and sister in law names, etc.   The patient demonstrated bed mobility with supervision. Able to sit EOB with good balance for several minutes especially with MD at bedside (~57minutes of total interuptions during PT session)/ sit <> stand several times, CGA for safety and ultimately supervision. Upon assessment the patient demonstrated UE and LE strength WFLs, coordination, light touch, visual fields all WFLs, no pronator drift noted. normaly sitting balance noted as well. Pt with intact automatic processing; able to recognize need to urinate, ambulated to bathroom in room, utilized and flushed toilet and washed hands all without assist of cueing. Several higher level balance activities performed as well with supervision/CGA, no LOB noted, no ataxia noted. Overall the patient demonstrated functional mobility, but did have some decreased safety awareness as well as decreased awareness of decreased executive and cognitive function. Recommendation is outpatient PT to maximize  safety, independence and return to PLOF.     Follow Up Recommendations Outpatient PT    Equipment Recommendations  None recommended by PT    Recommendations for Other Services       Precautions / Restrictions Precautions Precautions: Fall Restrictions Weight Bearing Restrictions: No      Mobility  Bed Mobility Overal bed mobility: Needs Assistance Bed Mobility: Supine to Sit     Supine to sit: Supervision;HOB elevated          Transfers Overall transfer level: Needs assistance Equipment used: None Transfers: Sit to/from Stand Sit to Stand: Min guard;Supervision         General transfer comment: pt able to do on command, without assist  Ambulation/Gait Ambulation/Gait assistance: Supervision;Min guard Gait Distance (Feet): 300 Feet Assistive device: None     Gait velocity interpretation: >4.37 ft/sec, indicative of normal walking speed General Gait Details: Pt able to ambulate WFLs, no lob, no ataxia noted, normaly gait speed.  Stairs            Wheelchair Mobility    Modified Rankin (Stroke Patients Only)       Balance Overall balance assessment: Needs assistance;Independent Sitting-balance support: Feet supported;No upper extremity supported Sitting balance-Leahy Scale: Normal       Standing balance-Leahy Scale: Normal   Single Leg Stance - Right Leg: 20 Single Leg Stance - Left Leg: 20     Rhomberg - Eyes Opened: 30 Rhomberg - Eyes Closed: 30 High level balance activites: Sudden stops;Head turns;Direction changes High Level Balance Comments: pt able to complete all higher level balance activities with CGA/supervision. no LOB noted             Pertinent Vitals/Pain Pain Assessment: No/denies pain  Home Living Family/patient expects to be discharged to:: Private residence Living Arrangements: Spouse/significant other Available Help at Discharge: Family Type of Home: House Home Access: Stairs to enter Entrance  Stairs-Rails: Doctor, general practice of Steps: 6 Home Layout: Two level Home Equipment: None      Prior Function Level of Independence: Independent         Comments: Pt able to provide some PLOF, family in room to confirm information     Hand Dominance   Dominant Hand: Right    Extremity/Trunk Assessment   Upper Extremity Assessment Upper Extremity Assessment: RUE deficits/detail;LUE deficits/detail RUE Deficits / Details: grossly 5/5, WFLs RUE Sensation: WNL RUE Coordination: WNL LUE Deficits / Details: grossly 5/5, WFLs LUE Sensation: WNL LUE Coordination: WNL    Lower Extremity Assessment Lower Extremity Assessment: RLE deficits/detail;LLE deficits/detail RLE Deficits / Details: grossly 5/5 RLE Sensation: WNL RLE Coordination: WNL LLE Deficits / Details: grossly 5/5 LLE Sensation: WNL LLE Coordination: WNL    Cervical / Trunk Assessment Cervical / Trunk Assessment: Normal  Communication   Communication:  (some confabulation noted)  Cognition     Overall Cognitive Status: Impaired/Different from baseline Area of Impairment: Orientation;Attention;Memory;Following commands;Problem solving;Awareness                 Orientation Level: Disoriented to;Time;Situation;Place Current Attention Level: Sustained Memory: Decreased short-term memory Following Commands: Follows one step commands consistently   Awareness: Emergent Problem Solving: Slow processing;Requires verbal cues;Requires tactile cues General Comments: pt is normally A&Ox4      General Comments      Exercises Other Exercises Other Exercises: extended time spent with pt/family, several MD's in and out of room during PT session. Pt able to sit EOB and converse. Other Exercises: MD assessment of UE and visual fields WFLs by PT observation. Other Exercises: Pt exhibited deficits in short term memory, unable to recall place, time, situation despite re-orientation several times  during session.   Assessment/Plan    PT Assessment Patient needs continued PT services  PT Problem List Decreased safety awareness;Decreased knowledge of precautions;Decreased cognition       PT Treatment Interventions DME instruction;Balance training;Gait training;Neuromuscular re-education;Stair training;Functional mobility training;Cognitive remediation;Patient/family education;Therapeutic activities;Therapeutic exercise;Manual techniques    PT Goals (Current goals can be found in the Care Plan section)  Acute Rehab PT Goals Patient Stated Goal: to return to PLOF, improve cognition PT Goal Formulation: With family Time For Goal Achievement: 04/27/20 Potential to Achieve Goals: Good Additional Goals Additional Goal #1: The patient will demonstrate ability to participate in cognitive remediation exercises (such dual tasking, multi-step commands, etc) to maximize return to PLOF, independence, and improved safety awareness.    Frequency Min 2X/week   Barriers to discharge        Co-evaluation               AM-PAC PT "6 Clicks" Mobility  Outcome Measure Help needed turning from your back to your side while in a flat bed without using bedrails?: None Help needed moving from lying on your back to sitting on the side of a flat bed without using bedrails?: None Help needed moving to and from a bed to a chair (including a wheelchair)?: None Help needed standing up from a chair using your arms (e.g., wheelchair or bedside chair)?: None Help needed to walk in hospital room?: None Help needed climbing 3-5 steps with a railing? : None 6 Click Score: 24    End of Session Equipment Utilized During Treatment: Gait belt Activity Tolerance:  Patient tolerated treatment well Patient left: in chair;with call bell/phone within reach;with family/visitor present;with chair alarm set Nurse Communication: Mobility status PT Visit Diagnosis: Other symptoms and signs involving the nervous  system (V49.449)    Time: 6759-1638 PT Time Calculation (min) (ACUTE ONLY): 53 min   Charges:   PT Evaluation $PT Eval Low Complexity: 1 Low PT Treatments $Therapeutic Exercise: 8-22 mins $Neuromuscular Re-education: 8-22 mins       Olga Coaster PT, DPT 1:29 PM,04/13/20

## 2020-04-13 NOTE — Procedures (Signed)
Patient Name: Joseph Dunlap  MRN: [id number]  Epilepsy Attending: Charlsie Quest  Referring Physician/Provider: Dr Rochele Raring Date: 04/13/2020 Duration: 24.49 mins  Patient history:  45 y.o. male with PMH significant for OSA who presents with acute onset hypersomnolence and predominant cognitive and attention deficit. EEG to evaluate for seizure  Level of alertness: Awake, asleep  AEDs during EEG study: None  Technical aspects: This EEG study was done with scalp electrodes positioned according to the 10-20 International system of electrode placement. Electrical activity was acquired at a sampling rate of 500Hz  and reviewed with a high frequency filter of 70Hz  and a low frequency filter of 1Hz . EEG data were recorded continuously and digitally stored.   Description: The posterior dominant rhythm consists of 9 Hz activity of moderate voltage (25-35 uV) seen predominantly in posterior head regions, symmetric and reactive to eye opening and eye closing. Sleep was characterized by vertex waves, sleep spindles (12 to 14 Hz), maximal frontocentral region. Physiologic photic driving was seen during photic stimulation.  Hyperventilation was not performed.     IMPRESSION: This study is within normal limits. No seizures or epileptiform discharges were seen throughout the recording.  Darris Staiger 

## 2020-04-15 LAB — DRUG SCREEN 10 W/CONF, SERUM
Amphetamines, IA: NEGATIVE ng/mL
Barbiturates, IA: NEGATIVE ug/mL
Benzodiazepines, IA: NEGATIVE ng/mL
Cocaine & Metabolite, IA: NEGATIVE ng/mL
Methadone, IA: NEGATIVE ng/mL
Opiates, IA: NEGATIVE ng/mL
Oxycodones, IA: NEGATIVE ng/mL
Phencyclidine, IA: NEGATIVE ng/mL
Propoxyphene, IA: NEGATIVE ng/mL
THC(Marijuana) Metabolite, IA: NEGATIVE ng/mL

## 2020-04-15 LAB — MPO/PR-3 (ANCA) ANTIBODIES
ANCA Proteinase 3: <3.5 U/mL (ref 0.0–3.5)
Myeloperoxidase Abs: <9 U/mL (ref 0.0–9.0)

## 2020-04-15 LAB — METHYLMALONIC ACID, SERUM: Methylmalonic Acid, Quantitative: 141 nmol/L (ref 0–378)

## 2020-04-16 LAB — ANTIPHOSPHOLIPID SYNDROME EVAL, BLD
Anticardiolipin IgA: <9 APL U/mL (ref 0–11)
Anticardiolipin IgG: <9 GPL U/mL (ref 0–14)
Anticardiolipin IgM: <9 MPL U/mL (ref 0–12)
DRVVT: 30 s (ref 0.0–47.0)
PTT Lupus Anticoagulant: 36.4 s (ref 0.0–51.9)
Phosphatydalserine, IgA: <1 APS Units (ref 0–19)
Phosphatydalserine, IgG: 11 Units (ref 0–30)
Phosphatydalserine, IgM: 13 Units (ref 0–30)

## 2020-04-18 LAB — FACTOR 5 LEIDEN

## 2020-04-18 LAB — MTHFR DNA ANALYSIS

## 2020-04-19 LAB — VITAMIN B1: Vitamin B1 (Thiamine): 143 nmol/L (ref 66.5–200.0)

## 2020-12-27 ENCOUNTER — Ambulatory Visit: Payer: Self-pay

## 2021-04-14 LAB — EXTERNAL GENERIC LAB PROCEDURE

## 2021-05-02 LAB — EXTERNAL GENERIC LAB PROCEDURE

## 2022-12-29 IMAGING — CR DG CHEST 1V PORT
1 series · 1 of 1 positions shown · non-contrast
Comparison: 10/13/2009

CLINICAL DATA: Altered mental status

EXAM:
PORTABLE CHEST 1 VIEW

[chest ap]
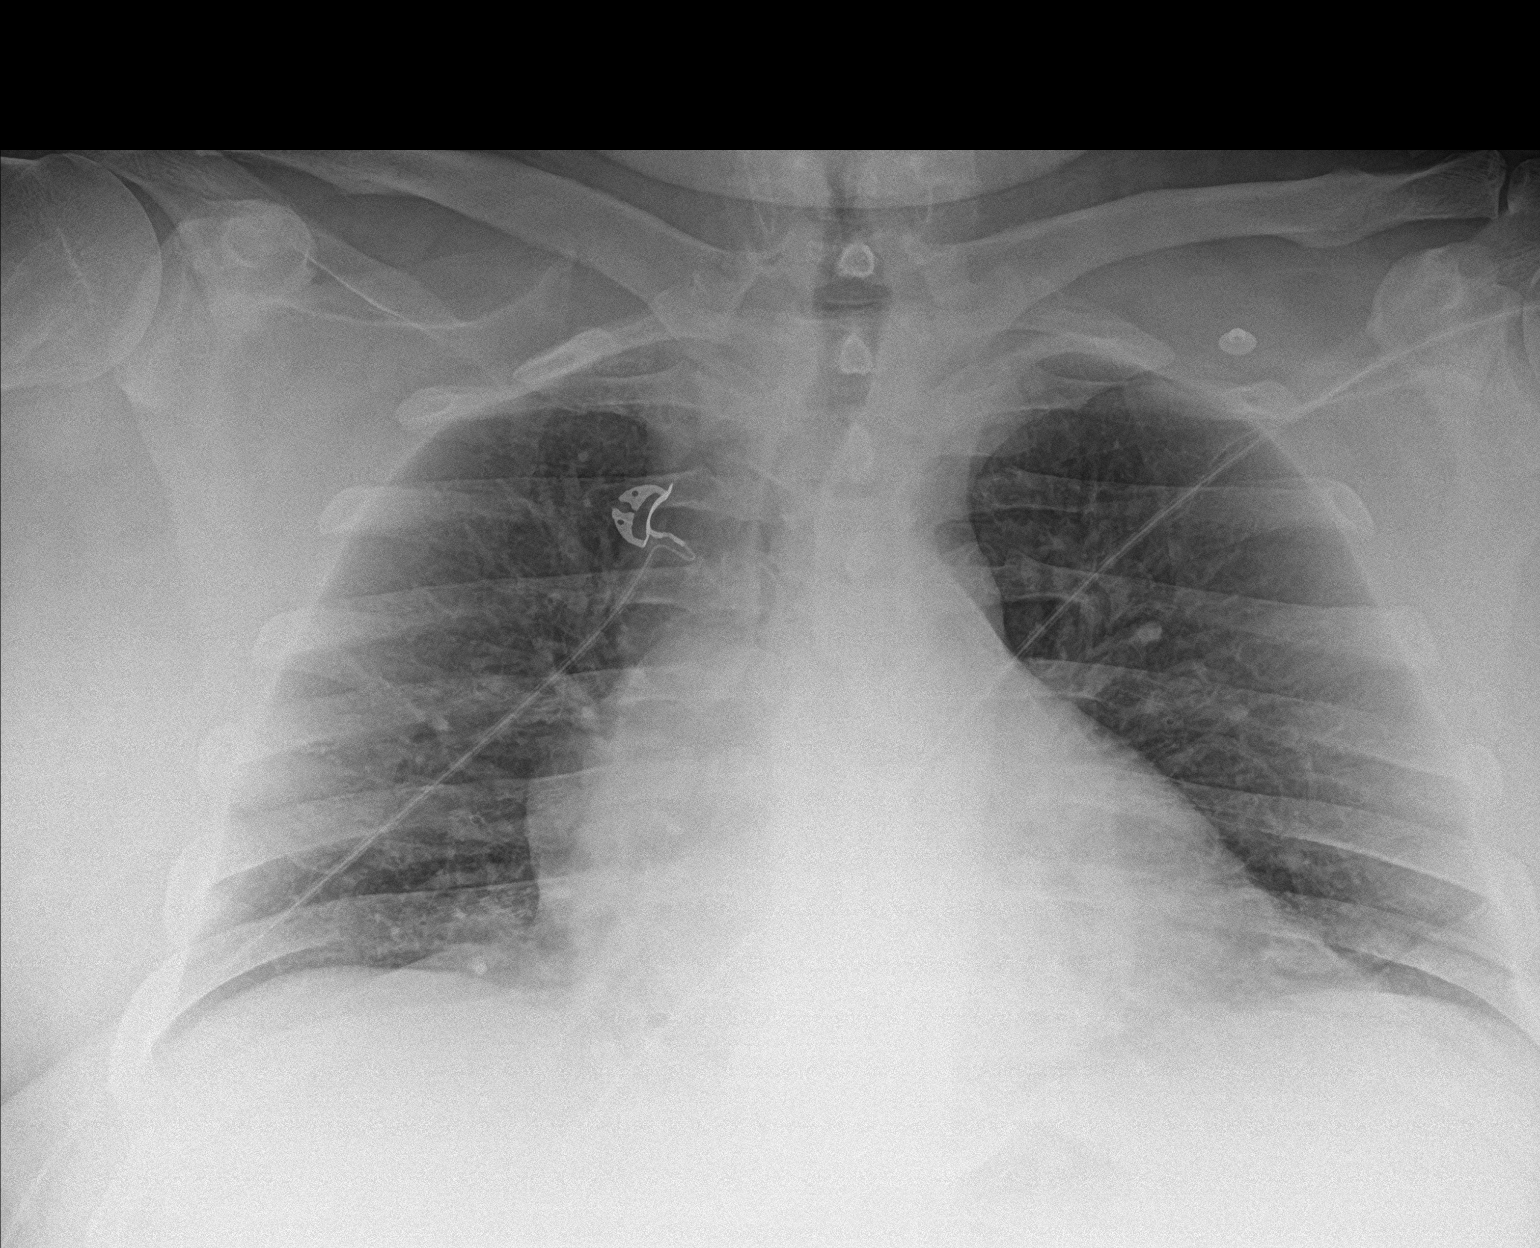

[1 of 1 positions shown; findings below may reference images not displayed]

FINDINGS: Low volume chest. There is no edema, consolidation, effusion, or
pneumothorax. Normal heart size and mediastinal contours for
technique.
IMPRESSION: Low volume chest without edema or infiltrate.

## 2023-01-22 ENCOUNTER — Other Ambulatory Visit: Payer: Self-pay

## 2023-01-22 ENCOUNTER — Emergency Department
Admission: EM | Admit: 2023-01-22 | Discharge: 2023-01-22 | Disposition: A | Discharge status: Home / Self Care | Payer: Medicare Other | Attending: Emergency Medicine | Admitting: Emergency Medicine

## 2023-01-22 DIAGNOSIS — I10 Essential (primary) hypertension: Secondary | ICD-10-CM | POA: Diagnosis not present

## 2023-01-22 DIAGNOSIS — E870 Hyperosmolality and hypernatremia: Secondary | ICD-10-CM

## 2023-01-22 DIAGNOSIS — R531 Weakness: Secondary | ICD-10-CM | POA: Diagnosis present

## 2023-01-22 HISTORY — DX: Cerebral infarction, unspecified: I63.9

## 2023-01-22 HISTORY — DX: Unspecified convulsions: R56.9

## 2023-01-22 LAB — COMPREHENSIVE METABOLIC PANEL
ALT: 46 U/L — ABNORMAL HIGH (ref 0–44)
AST: 38 U/L (ref 15–41)
Albumin: 3 g/dL — ABNORMAL LOW (ref 3.5–5.0)
Alkaline Phosphatase: 61 U/L (ref 38–126)
Anion gap: 6 (ref 5–15)
BUN: 25 mg/dL — ABNORMAL HIGH (ref 6–20)
CO2: 28 mmol/L (ref 22–32)
Calcium: 8.6 mg/dL — ABNORMAL LOW (ref 8.9–10.3)
Chloride: 114 mmol/L — ABNORMAL HIGH (ref 98–111)
Creatinine, Ser: 0.93 mg/dL (ref 0.61–1.24)
GFR, Estimated: >60 mL/min (ref >60)
Glucose, Bld: 152 mg/dL — ABNORMAL HIGH (ref 70–99)
Potassium: 3.3 mmol/L — ABNORMAL LOW (ref 3.5–5.1)
Sodium: 148 mmol/L — ABNORMAL HIGH (ref 135–145)
Total Bilirubin: 1.1 mg/dL (ref <1.2)
Total Protein: 5.5 g/dL — ABNORMAL LOW (ref 6.5–8.1)

## 2023-01-22 LAB — CBC WITH DIFFERENTIAL/PLATELET
Abs Immature Granulocytes: 0.02 10*3/uL (ref 0.00–0.07)
Basophils Absolute: 0 10*3/uL (ref 0.0–0.1)
Basophils Relative: 0 %
Eosinophils Absolute: 0.1 10*3/uL (ref 0.0–0.5)
Eosinophils Relative: 1 %
HCT: 49.6 % (ref 39.0–52.0)
Hemoglobin: 15.7 g/dL (ref 13.0–17.0)
Immature Granulocytes: 0 %
Lymphocytes Relative: 14 %
Lymphs Abs: 1.3 10*3/uL (ref 0.7–4.0)
MCH: 30.3 pg (ref 26.0–34.0)
MCHC: 31.7 g/dL (ref 30.0–36.0)
MCV: 95.8 fL (ref 80.0–100.0)
Monocytes Absolute: 0.8 10*3/uL (ref 0.1–1.0)
Monocytes Relative: 8 %
Neutro Abs: 7.2 10*3/uL (ref 1.7–7.7)
Neutrophils Relative %: 77 %
Platelets: 146 10*3/uL — ABNORMAL LOW (ref 150–400)
RBC: 5.18 MIL/uL (ref 4.22–5.81)
RDW: 13.3 % (ref 11.5–15.5)
WBC: 9.4 10*3/uL (ref 4.0–10.5)
nRBC: 0 % (ref 0.0–0.2)

## 2023-01-22 NOTE — Discharge Instructions (Signed)
Joseph Dunlap was seen in the ER for his elevated sodium levels.  His blood work today showed improvement in his sodium to 148.  Please follow-up with his outpatient doctors as directed.  Return to the ER for any new or worsening symptoms as you feel is appropriate with his goals of care.

## 2023-01-22 NOTE — ED Notes (Signed)
Report given to [facility] at [facility].

## 2023-01-22 NOTE — ED Triage Notes (Signed)
Patient to ED from [facility] for elevated sodium, history of same as well as seizures. Patient is non verbal at baseline. Was given 2L 0.45% NS over the past 2 days.

## 2023-01-22 NOTE — ED Provider Notes (Signed)
[facility] Provider Note    Event Date/Time   First MD Initiated Contact with Patient 01/22/23 [phone]     (approximate)   History   Weakness   HPI  Joseph Dunlap is a 47 year old male with history of HTN, CVA, vasculitis presenting to the emergency department for evaluation of weakness.  Nonverbal at baseline.  Reportedly had elevated sodium at his facility.  Paperwork reports a sodium of 157 on 12/9.  Reportedly was given 2 L of half-normal saline over the past 2 days.  Collateral history obtained from patient's wife who later presented to bedside.  She reports that a few days ago, patient had a minor fall.  He seemed potentially more weak than his baseline, so labs were obtained at that time.  That is when she was informed that patient had elevated sodium that worsened on repeat, so patient was sent to the ER.  She does feel the patient is currently at his baseline.  She does not suspect significant trauma from his fall.  She does report that patient's goals of care are comfort measures and his MOST form does reflect this.     Physical Exam   Triage Vital Signs: ED Triage Vitals  Encounter Vitals Group     BP 01/22/23 0950 110/79     Systolic BP Percentile --      Diastolic BP Percentile --      Pulse Rate 01/22/23 0948 96     Resp 01/22/23 0948 18     Temp 01/22/23 0948 98.2 F (36.8 C)     Temp Source 01/22/23 0948 Oral     SpO2 01/22/23 0948 98 %     Weight 01/22/23 0946 255 lb 11.2 oz (116 kg)     Height --      Head Circumference --      Peak Flow --      Pain Score 01/22/23 0946 0     Pain Loc --      Pain Education --      Exclude from Growth Chart --     Most recent vital signs: Vitals:   01/22/23 1100 01/22/23 1130  BP: (!) 103/54 (!) 113/50  Pulse: 76 69  Resp: 14 16  Temp:    SpO2: 95% 95%     General: Somnolent but arousable  CV:  Regular rate, good peripheral perfusion.  Resp:  Unlabored respirations, lungs clear to  auscultation Abd:  Nondistended. Neuro:  No gross facial asymmetry, following commands and all 4 extremities, at neurologic baseline per wife   ED Results / Procedures / Treatments   Labs (all labs ordered are listed, but only abnormal results are displayed) Labs Reviewed  CBC WITH DIFFERENTIAL/PLATELET - Abnormal; Notable for the following components:      Result Value   Platelets 146 (*)    All other components within normal limits  COMPREHENSIVE METABOLIC PANEL - Abnormal; Notable for the following components:   Sodium 148 (*)    Potassium 3.3 (*)    Chloride 114 (*)    Glucose, Bld 152 (*)    BUN 25 (*)    Calcium 8.6 (*)    Total Protein 5.5 (*)    Albumin 3.0 (*)    ALT 46 (*)    All other components within normal limits     EKG EKG independently reviewed interpreted by myself (ER attending) demonstrates:  EKG demonstrates a rate of 87, PR 156, QRS 88, QTc 459, no  acute ST changes  RADIOLOGY Imaging independently reviewed and interpreted by myself demonstrates:    PROCEDURES:  Critical Care performed: No  Procedures   MEDICATIONS ORDERED IN ED: Medications - No data to display   IMPRESSION / MDM / ASSESSMENT AND PLAN / ED COURSE  I reviewed the triage vital signs and the nursing notes.  Differential diagnosis includes, but is not limited to, electrolyte abnormality, anemia, infection, dehydration  Patient's presentation is most consistent with acute presentation with potential threat to life or bodily function.  47 year old male presenting to the emergency department for evaluation of weakness with hyponatremia on outpatient labs.  Labs repeated here with significant improvement in his hypernatremia to 148.  No critical derangements noted.  Case discussed with patient's wife.  She does feel that he is at his neurologic baseline.  She does not feel that further evaluation is indicated at this time.  Particularly in light of patient's MOST form requesting  comfort measures, do think it is reasonable to hold off on any further testing currently.  Strict return precautions were provided.  Patient was discharged stable condition.      FINAL CLINICAL IMPRESSION(S) / ED DIAGNOSES   Final diagnoses:  Hypernatremia  Generalized weakness     Rx / DC Orders   ED Discharge Orders     None        Note:  This document was prepared using Dragon voice recognition software and may include unintentional dictation errors.   Trinna Post, MD 01/22/23 [phone]
